# Patient Record
Sex: Female | Born: 1935 | Race: White | Hispanic: No | Smoking: Never smoker
Health system: Southern US, Community
[De-identification: ages and names within clinical notes are randomized; demographics above are authoritative.]

## PROBLEM LIST (undated history)

## (undated) DIAGNOSIS — I10 Essential (primary) hypertension: Secondary | ICD-10-CM

## (undated) DIAGNOSIS — E7800 Pure hypercholesterolemia, unspecified: Secondary | ICD-10-CM

---

## 2018-07-22 ENCOUNTER — Emergency Department: Payer: Medicare Other

## 2018-07-22 ENCOUNTER — Other Ambulatory Visit: Payer: Self-pay

## 2018-07-22 ENCOUNTER — Emergency Department
Admission: EM | Admit: 2018-07-22 | Discharge: 2018-07-22 | Disposition: A | Discharge status: Home / Self Care | Payer: Medicare Other | Attending: Emergency Medicine | Admitting: Emergency Medicine

## 2018-07-22 ENCOUNTER — Encounter: Payer: Self-pay | Admitting: Emergency Medicine

## 2018-07-22 DIAGNOSIS — W010XXA Fall on same level from slipping, tripping and stumbling without subsequent striking against object, initial encounter: Secondary | ICD-10-CM | POA: Diagnosis not present

## 2018-07-22 DIAGNOSIS — I10 Essential (primary) hypertension: Secondary | ICD-10-CM | POA: Diagnosis not present

## 2018-07-22 DIAGNOSIS — M25551 Pain in right hip: Secondary | ICD-10-CM

## 2018-07-22 DIAGNOSIS — S79911A Unspecified injury of right hip, initial encounter: Secondary | ICD-10-CM | POA: Diagnosis present

## 2018-07-22 DIAGNOSIS — Y9389 Activity, other specified: Secondary | ICD-10-CM | POA: Diagnosis not present

## 2018-07-22 DIAGNOSIS — Y999 Unspecified external cause status: Secondary | ICD-10-CM | POA: Diagnosis not present

## 2018-07-22 DIAGNOSIS — Y92008 Other place in unspecified non-institutional (private) residence as the place of occurrence of the external cause: Secondary | ICD-10-CM | POA: Insufficient documentation

## 2018-07-22 DIAGNOSIS — S7001XA Contusion of right hip, initial encounter: Secondary | ICD-10-CM

## 2018-07-22 DIAGNOSIS — W19XXXA Unspecified fall, initial encounter: Secondary | ICD-10-CM

## 2018-07-22 HISTORY — DX: Essential (primary) hypertension: I10

## 2018-07-22 HISTORY — DX: Pure hypercholesterolemia, unspecified: E78.00

## 2018-07-22 NOTE — ED Notes (Signed)
Pt demonstrated walker use and verbalized feeling safe being discharged. NAD at this time and pt able to ambulate. Pt taken to the lobby via a wheelchai and will be taken home by son.

## 2018-07-22 NOTE — ED Notes (Signed)
Pt reporting pain when moving right leg and pain when bearing weight. Pain is worse in the posterior thigh. No deformity, discoloration or swelling noted upon assessment. Pt able to bear weight and pt able to move leg.

## 2018-07-22 NOTE — ED Triage Notes (Signed)
Pt presents to ED via POV with c/o of right hip pain. Pt son states pt fell yesterday evening while walking outside. Pt states she tripped on rocks and was able to stand back up on her own. Pt denies any LOC or dizziness at time of fall. Pt woke up this morning and was unable to bear any weight on right leg. Pt in NAD at this time.

## 2018-07-22 NOTE — Discharge Instructions (Addendum)
Your xray today did not show any fracture. Please follow up with your doctor for continued monitoring of your symptoms.

## 2018-07-22 NOTE — ED Provider Notes (Signed)
[facility] Emergency Department Provider Note  ____________________________________________  Time seen: Approximately 6:55 PM  I have reviewed the triage vital signs and the nursing notes.   HISTORY  Chief Complaint Hip Pain and Fall    HPI Barbara Lara is a 84 y.o. female with a history of hypertension and hyperlipidemia is brought to the ED today due to right hip pain.  Patient was in her usual state of health when she had a trip and fall yesterday while feeding cats in the garage.  No head injury.  She sustained a small laceration to the left shin, was able to get up and walk immediately afterward.  She did had some gradual onset of right hip pain with walking yesterday evening, worse this morning.  She is able to bear weight still but notes that it hurts whenever she moves.  Nonradiating, moderate intensity, aching.  No paresthesias or weakness.  Denies head injury vision changes or neck pain.  Went to primary care today who sent her to the ED to evaluate for possible hip fracture.      Past Medical History:  Diagnosis Date  . Hypercholesteremia   . Hypertension      There are no active problems to display for this patient.    History reviewed. No pertinent surgical history.   Prior to Admission medications   Not on File  Atenolol Statin Amlodipine   Allergies Avelox [moxifloxacin hcl in nacl]; Lisinopril; and Penicillins   No family history on file.  Social History Social History   Tobacco Use  . Smoking status: Never Smoker  . Smokeless tobacco: Never Used  Substance Use Topics  . Alcohol use: Never    Frequency: Never  . Drug use: Never    Review of Systems  Constitutional:   No fever or chills.  Cardiovascular:   No chest pain or syncope. Respiratory:   No dyspnea or cough. Gastrointestinal:   Negative for abdominal pain, vomiting and diarrhea.  Musculoskeletal: Right hip pain as above. All other systems reviewed  and are negative except as documented above in ROS and HPI.  ____________________________________________   PHYSICAL EXAM:  VITAL SIGNS: ED Triage Vitals  Enc Vitals Group     BP 07/22/18 1313 (!) 122/59     Pulse Rate 07/22/18 1313 63     Resp 07/22/18 1313 20     Temp 07/22/18 1313 98.3 F (36.8 C)     Temp Source 07/22/18 1313 Oral     SpO2 07/22/18 1313 96 %     Weight 07/22/18 1321 98 lb (44.5 kg)     Height 07/22/18 1321 5\' 5"  (1.651 m)     Head Circumference --      Peak Flow --      Pain Score 07/22/18 1314 7     Pain Loc --      Pain Edu? --      Excl. in GC? --     Vital signs reviewed, nursing assessments reviewed.   Constitutional:   Alert and oriented. Non-toxic appearance. Eyes:   Conjunctivae are normal. EOMI. PERRL. ENT      Head:   Normocephalic and atraumatic.      Nose:   No congestion/rhinnorhea.       Mouth/Throat:   MMM, no pharyngeal erythema. No peritonsillar mass.       Neck:   No meningismus. Full ROM.  Midline spinal tenderness. Hematological/Lymphatic/Immunilogical:   No cervical lymphadenopathy. Cardiovascular:   RRR. Symmetric bilateral radial  and DP pulses.  No murmurs. Cap refill less than 2 seconds. Respiratory:   Normal respiratory effort without tachypnea/retractions. Breath sounds are clear and equal bilaterally. No wheezes/rales/rhonchi. Gastrointestinal:   Soft and nontender. Non distended. There is no CVA tenderness.  No rebound, rigidity, or guarding. Musculoskeletal:   Normal range of motion in all extremities. No joint effusions.  No focal lower extremity tenderness.  No edema.  Pain with movement of the leg on the medial and lateral right hip.  Able to stand, no pain with static standing, but pain when she starts walking. Neurologic:   Normal speech and language.  Motor grossly intact. No acute focal neurologic deficits are appreciated.  Skin:    Skin is warm, dry with a small 1 cm laceration on the left shin, not gaping, not  inflamed, hemostatic.  ____________________________________________    LABS (pertinent positives/negatives) (all labs ordered are listed, but only abnormal results are displayed) Labs Reviewed - No data to display ____________________________________________   EKG    ____________________________________________    RADIOLOGY  Dg Hip Unilat  With Pelvis 2-3 Views Right  Result Date: 07/22/2018 CLINICAL DATA:  84 year old female status post fall in driveway yesterday with right hip pain. EXAM: DG HIP (WITH OR WITHOUT PELVIS) 2-3V RIGHT COMPARISON:  None. FINDINGS: The femoral heads are normally located. Hip joint spaces are within normal limits for age, mildly decreased on the right. Osteopenia. No pelvis fracture identified. Sacrum detail is limited due to overlying bowel. Negative bowel gas pattern. Grossly intact proximal left femur. The proximal right femur appears intact. IMPRESSION: No acute fracture or dislocation identified about the right hip or pelvis. If occult hip fracture is suspected or if the patient is unable to weightbear, MRI is the preferred modality for further evaluation. Electronically Signed   By: Odessa FlemingH  Hall M.D.   On: 07/22/2018 14:31    ____________________________________________   PROCEDURES Procedures  ____________________________________________    CLINICAL IMPRESSION / ASSESSMENT AND PLAN / ED COURSE  Pertinent labs & imaging results that were available during my care of the patient were reviewed by me and considered in my medical decision making (see chart for details).    Patient presents with right hip pain after a fall yesterday.  Able to bear weight, x-ray unremarkable.  Does show osteopenia but with her ability to bear weight without severe pain, and only pain with walking I doubt an occult hip fracture.  Would not do an MRI right now, outpatient follow-up.  If symptoms do not improve or worsen then at that point I will pursue further advanced  imaging.  Does not need a tetanus shot today.  Small laceration on the left shin is not repairable as the injury occurred 24 hours ago.  No other apparent traumatic injuries, suitable for discharge home.  Provided a walker for stability and symptom relief.  Advised NSAIDs.. Clinical Course as of Jul 22 1854  Wed Jul 22, 2018  1738 Tetanus up to date from one year ago.    [PS]    Clinical Course User Index [PS] Sharman CheekStafford, Ramina Hulet, MD     ____________________________________________   FINAL CLINICAL IMPRESSION(S) / ED DIAGNOSES    Final diagnoses:  Fall, initial encounter  Right hip pain  Contusion of right hip, initial encounter     ED Discharge Orders    None      Portions of this note were generated with dragon dictation software. Dictation errors may occur despite best attempts at proofreading.  Sharman CheekStafford, Tymara Saur, MD 07/22/18 [phone]

## 2018-07-22 NOTE — ED Notes (Signed)
Pt able to stand and ambulate to the bedside toilet without difficulty. Pt verbalized increased pain but was able to ambulate independently.

## 2019-01-28 ENCOUNTER — Encounter: Payer: Self-pay | Admitting: *Deleted

## 2019-01-28 ENCOUNTER — Emergency Department: Payer: Medicare Other

## 2019-01-28 ENCOUNTER — Inpatient Hospital Stay
Admission: EM | Admit: 2019-01-28 | Discharge: 2019-02-03 | DRG: 640 | Disposition: A | Discharge status: Home Health | Payer: Medicare Other | Attending: Internal Medicine | Admitting: Internal Medicine

## 2019-01-28 ENCOUNTER — Other Ambulatory Visit: Payer: Self-pay

## 2019-01-28 DIAGNOSIS — R059 Cough, unspecified: Secondary | ICD-10-CM

## 2019-01-28 DIAGNOSIS — E869 Volume depletion, unspecified: Secondary | ICD-10-CM | POA: Diagnosis present

## 2019-01-28 DIAGNOSIS — E785 Hyperlipidemia, unspecified: Secondary | ICD-10-CM | POA: Diagnosis present

## 2019-01-28 DIAGNOSIS — E7800 Pure hypercholesterolemia, unspecified: Secondary | ICD-10-CM | POA: Diagnosis present

## 2019-01-28 DIAGNOSIS — Z79899 Other long term (current) drug therapy: Secondary | ICD-10-CM | POA: Diagnosis not present

## 2019-01-28 DIAGNOSIS — E43 Unspecified severe protein-calorie malnutrition: Secondary | ICD-10-CM

## 2019-01-28 DIAGNOSIS — Z888 Allergy status to other drugs, medicaments and biological substances status: Secondary | ICD-10-CM | POA: Diagnosis not present

## 2019-01-28 DIAGNOSIS — R05 Cough: Secondary | ICD-10-CM

## 2019-01-28 DIAGNOSIS — T502X5A Adverse effect of carbonic-anhydrase inhibitors, benzothiadiazides and other diuretics, initial encounter: Secondary | ICD-10-CM | POA: Diagnosis present

## 2019-01-28 DIAGNOSIS — J209 Acute bronchitis, unspecified: Secondary | ICD-10-CM | POA: Diagnosis present

## 2019-01-28 DIAGNOSIS — Z7982 Long term (current) use of aspirin: Secondary | ICD-10-CM | POA: Diagnosis not present

## 2019-01-28 DIAGNOSIS — E876 Hypokalemia: Secondary | ICD-10-CM | POA: Diagnosis present

## 2019-01-28 DIAGNOSIS — I10 Essential (primary) hypertension: Secondary | ICD-10-CM | POA: Diagnosis present

## 2019-01-28 DIAGNOSIS — E871 Hypo-osmolality and hyponatremia: Secondary | ICD-10-CM | POA: Diagnosis present

## 2019-01-28 DIAGNOSIS — Z66 Do not resuscitate: Secondary | ICD-10-CM | POA: Diagnosis present

## 2019-01-28 DIAGNOSIS — Z880 Allergy status to penicillin: Secondary | ICD-10-CM | POA: Diagnosis not present

## 2019-01-28 LAB — URINALYSIS, COMPLETE (UACMP) WITH MICROSCOPIC
Bacteria, UA: NONE SEEN
Bilirubin Urine: NEGATIVE
Glucose, UA: NEGATIVE mg/dL
Hgb urine dipstick: NEGATIVE
KETONES UR: 5 mg/dL — AB
Leukocytes,Ua: NEGATIVE
Nitrite: NEGATIVE
PROTEIN: NEGATIVE mg/dL
Specific Gravity, Urine: 1.015 (ref 1.005–1.030)
pH: 6 (ref 5.0–8.0)

## 2019-01-28 LAB — BASIC METABOLIC PANEL
Anion gap: 15 (ref 5–15)
BUN: 20 mg/dL (ref 8–23)
CHLORIDE: 72 mmol/L — AB (ref 98–111)
CO2: 27 mmol/L (ref 22–32)
Calcium: 9.1 mg/dL (ref 8.9–10.3)
Creatinine, Ser: 0.86 mg/dL (ref 0.44–1.00)
GFR calc Af Amer: >60 mL/min (ref >60)
GFR calc non Af Amer: 59 mL/min — ABNORMAL LOW (ref >60)
GLUCOSE: 119 mg/dL — AB (ref 70–99)
Potassium: 3.1 mmol/L — ABNORMAL LOW (ref 3.5–5.1)
SODIUM: 114 mmol/L — AB (ref 135–145)

## 2019-01-28 LAB — SODIUM, URINE, RANDOM: Sodium, Ur: <10 mmol/L

## 2019-01-28 LAB — CBC
HCT: 42.5 % (ref 36.0–46.0)
HEMATOCRIT: 40.5 % (ref 36.0–46.0)
HEMOGLOBIN: 15.7 g/dL — AB (ref 12.0–15.0)
Hemoglobin: 14.7 g/dL (ref 12.0–15.0)
MCH: 30.2 pg (ref 26.0–34.0)
MCH: 29.9 pg (ref 26.0–34.0)
MCHC: 36.9 g/dL — ABNORMAL HIGH (ref 30.0–36.0)
MCHC: 36.3 g/dL — ABNORMAL HIGH (ref 30.0–36.0)
MCV: 81.7 fL (ref 80.0–100.0)
MCV: 82.5 fL (ref 80.0–100.0)
PLATELETS: 308 10*3/uL (ref 150–400)
Platelets: 307 10*3/uL (ref 150–400)
RBC: 5.2 MIL/uL — AB (ref 3.87–5.11)
RBC: 4.91 MIL/uL (ref 3.87–5.11)
RDW: 11.8 % (ref 11.5–15.5)
RDW: 11.8 % (ref 11.5–15.5)
WBC: 7.3 10*3/uL (ref 4.0–10.5)
WBC: 7.7 10*3/uL (ref 4.0–10.5)
nRBC: 0 % (ref 0.0–0.2)
nRBC: 0 % (ref 0.0–0.2)

## 2019-01-28 LAB — CREATININE, SERUM
Creatinine, Ser: 0.88 mg/dL (ref 0.44–1.00)
GFR calc Af Amer: >60 mL/min (ref >60)
GFR calc non Af Amer: 57 mL/min — ABNORMAL LOW (ref >60)

## 2019-01-28 LAB — OSMOLALITY, URINE: Osmolality, Ur: 501 mOsm/kg (ref 300–900)

## 2019-01-28 LAB — SODIUM: Sodium: 114 mmol/L — CL (ref 135–145)

## 2019-01-28 MED ORDER — SODIUM CHLORIDE 0.9 % IV SOLN
INTRAVENOUS | Status: DC
  Administered 2019-01-28 – 2019-01-30 (×3): via INTRAVENOUS

## 2019-01-28 MED ORDER — ACETAMINOPHEN 650 MG RE SUPP: 650.0000 mg | Freq: Four times a day (QID) | RECTAL | Status: DC | PRN

## 2019-01-28 MED ORDER — SENNOSIDES-DOCUSATE SODIUM 8.6-50 MG PO TABS
1.0000 | ORAL_TABLET | Freq: Every evening | ORAL | Status: DC | PRN
Start: 2019-01-28 — End: 2019-02-03

## 2019-01-28 MED ORDER — POTASSIUM CHLORIDE CRYS ER 20 MEQ PO TBCR
40.0000 meq | EXTENDED_RELEASE_TABLET | Freq: Once | ORAL | Status: AC
  Administered 2019-01-28: 40 meq via ORAL
  Filled 2019-01-28: qty 2

## 2019-01-28 MED ORDER — ACETAMINOPHEN 325 MG PO TABS
650.0000 mg | ORAL_TABLET | Freq: Four times a day (QID) | ORAL | Status: DC | PRN
  Administered 2019-02-03: 650 mg via ORAL
  Filled 2019-01-28: qty 2

## 2019-01-28 MED ORDER — ONDANSETRON HCL 4 MG/2ML IJ SOLN: 4.0000 mg | Freq: Four times a day (QID) | INTRAMUSCULAR | Status: DC | PRN

## 2019-01-28 MED ORDER — SIMVASTATIN 10 MG PO TABS
10.0000 mg | ORAL_TABLET | Freq: Every day | ORAL | Status: DC
  Administered 2019-01-28 – 2019-02-02 (×6): 10 mg via ORAL
  Filled 2019-01-28 (×7): qty 1

## 2019-01-28 MED ORDER — ONDANSETRON HCL 4 MG PO TABS: 4.0000 mg | ORAL_TABLET | Freq: Four times a day (QID) | ORAL | Status: DC | PRN

## 2019-01-28 MED ORDER — ATENOLOL 50 MG PO TABS
75.0000 mg | ORAL_TABLET | Freq: Two times a day (BID) | ORAL | Status: DC
  Administered 2019-01-28 – 2019-02-03 (×12): 75 mg via ORAL
  Filled 2019-01-28: qty 3
  Filled 2019-01-28 (×8): qty 1
  Filled 2019-01-28: qty 3
  Filled 2019-01-28 (×3): qty 1

## 2019-01-28 MED ORDER — DOXYCYCLINE HYCLATE 100 MG PO TABS
100.0000 mg | ORAL_TABLET | Freq: Two times a day (BID) | ORAL | Status: DC
  Administered 2019-01-28 – 2019-02-01 (×9): 100 mg via ORAL
  Filled 2019-01-28 (×9): qty 1

## 2019-01-28 MED ORDER — ENOXAPARIN SODIUM 40 MG/0.4ML ~~LOC~~ SOLN
40.0000 mg | SUBCUTANEOUS | Status: DC
  Administered 2019-01-29: 40 mg via SUBCUTANEOUS
  Filled 2019-01-28 (×2): qty 0.4

## 2019-01-28 MED ORDER — ASPIRIN EC 81 MG PO TBEC
81.0000 mg | DELAYED_RELEASE_TABLET | Freq: Every day | ORAL | Status: DC
  Administered 2019-01-29 – 2019-02-03 (×6): 81 mg via ORAL
  Filled 2019-01-28 (×6): qty 1

## 2019-01-28 NOTE — ED Notes (Signed)
Patient ambulated to restroom and help back to bed.

## 2019-01-28 NOTE — ED Triage Notes (Signed)
PT to ED reporting congested cough with yellow sputum and  Congestion. Pt reporting, "I just don't feel right in the head" Son reporting pt had an episode of SOB and potential syncopal episodes this morning while trying to wake up. No fevers, vomiting or diarrhea. Pt has had decreased appetite.

## 2019-01-28 NOTE — H&P (Signed)
[facility] [place] [facility]   PATIENT NAME: Barbara Lara    MR#:  [id number]  DATE OF BIRTH:  05-09-1925  DATE OF ADMISSION:  01/28/2019  PRIMARY CARE PHYSICIAN: Dan Humphreys, [facility]   REQUESTING/REFERRING PHYSICIAN: Dr. Phineas Real  CHIEF COMPLAINT:   Weakness HISTORY OF PRESENT ILLNESS:  Barbara Lara  is a 85 y.o. female with a known history of essential hypertension who presents to the emergency room with her son due to generalized weakness and productive cough.  Over the past few days patient has had a decreased p.o. appetite and has been complaining of a productive cough with yellowish sputum.  She has had no fevers.  She reports that she just does not feel right.  While in the emergency room laboratory work reveals a sodium level of 114. she is on hydrochlorothiazide for blood pressure.  She denies chest pain or shortness of breath.  PAST MEDICAL HISTORY:   Past Medical History:  Diagnosis Date  . Hypercholesteremia   . Hypertension     PAST SURGICAL HISTORY:  History reviewed. No pertinent surgical history.  SOCIAL HISTORY:   Social History   Tobacco Use  . Smoking status: Never Smoker  . Smokeless tobacco: Never Used  Substance Use Topics  . Alcohol use: Never    Frequency: Never    FAMILY HISTORY:  History reviewed. No pertinent family history.  DRUG ALLERGIES:   Allergies  Allergen Reactions  . Avelox [Moxifloxacin Hcl In Nacl]     Hallucinations  . Lisinopril     Facial edema   . Penicillins     unknown    REVIEW OF SYSTEMS:   Review of Systems  Constitutional: Positive for malaise/fatigue. Negative for chills and fever.  HENT: Negative.  Negative for ear discharge, ear pain, hearing loss, nosebleeds and sore throat.   Eyes: Negative.  Negative for blurred vision and pain.  Respiratory: Positive for cough. Negative for hemoptysis, shortness of breath and wheezing.   Cardiovascular: Negative.  Negative for chest  pain, palpitations and leg swelling.  Gastrointestinal: Negative.  Negative for abdominal pain, blood in stool, diarrhea, nausea and vomiting.  Genitourinary: Negative.  Negative for dysuria.  Musculoskeletal: Negative.  Negative for back pain.  Skin: Negative.   Neurological: Negative for dizziness, tremors, speech change, focal weakness, seizures and headaches.  Endo/Heme/Allergies: Negative.  Does not bruise/bleed easily.  Psychiatric/Behavioral: Negative.  Negative for depression, hallucinations and suicidal ideas.    MEDICATIONS AT HOME:   Prior to Admission medications   Medication Sig Start Date End Date Taking? Authorizing Provider  aspirin (ASPIRIN ADULT LOW DOSE) 81 MG EC tablet Take 81 mg by mouth daily. 06/02/08  Yes [provider]  atenolol (TENORMIN) 50 MG tablet Take 75 mg by mouth 2 (two) times daily. 02/13/18  Yes [provider]  cloNIDine (CATAPRES) 0.1 MG tablet Take 0.1 mg by mouth daily as needed. (take only if systolic blood pressure is greater than 180) 07/17/18  Yes [provider]  hydrochlorothiazide (HYDRODIURIL) 25 MG tablet Take 25 mg by mouth daily. 12/10/18  Yes [provider]  simvastatin (ZOCOR) 10 MG tablet Take 10 mg by mouth Nightly. 03/16/18  Yes [provider]      VITAL SIGNS:  Blood pressure 131/64, pulse 62, temperature 97.7 F (36.5 C), temperature source Oral, resp. rate 16, weight 44.5 kg, SpO2 93 %.  PHYSICAL EXAMINATION:   Physical Exam Constitutional:      General: She is not in  acute distress. HENT:     Head: Normocephalic.  Eyes:     General: No scleral icterus. Neck:     Musculoskeletal: Normal range of motion and neck supple.     Vascular: No JVD.     Trachea: No tracheal deviation.  Cardiovascular:     Rate and Rhythm: Normal rate and regular rhythm.     Heart sounds: Normal heart sounds. No murmur. No friction rub. No gallop.   Pulmonary:     Effort: Pulmonary effort is normal. No  respiratory distress.     Breath sounds: Normal breath sounds. No wheezing or rales.  Chest:     Chest wall: No tenderness.  Abdominal:     General: Bowel sounds are normal. There is no distension.     Palpations: Abdomen is soft. There is no mass.     Tenderness: There is no abdominal tenderness. There is no guarding or rebound.  Musculoskeletal: Normal range of motion.  Skin:    General: Skin is warm.     Findings: No erythema or rash.  Neurological:     Mental Status: She is alert and oriented to person, place, and time.  Psychiatric:        Judgment: Judgment normal.       LABORATORY PANEL:   CBC Recent Labs  Lab 01/28/19 1213  WBC 7.3  HGB 15.7*  HCT 42.5  PLT 308   ------------------------------------------------------------------------------------------------------------------  Chemistries  Recent Labs  Lab 01/28/19 1213  NA 114*  K 3.1*  CL 72*  CO2 27  GLUCOSE 119*  BUN 20  CREATININE 0.86  CALCIUM 9.1   ------------------------------------------------------------------------------------------------------------------  Cardiac Enzymes No results for input(s): TROPONINI in the last 168 hours. ------------------------------------------------------------------------------------------------------------------  RADIOLOGY:  Dg Chest 2 View  Result Date: 01/28/2019 CLINICAL DATA:  Cough and congestion. EXAM: CHEST - 2 VIEW COMPARISON:  None. FINDINGS: The heart is mildly enlarged. There is moderate tortuosity and calcification of the thoracic aorta. The lungs are clear of an acute process. No worrisome pulmonary lesions. No pleural effusion. The bony thorax is intact. Remote healed rib fractures are noted and there is also a remote appearing compression fracture of T8. IMPRESSION: No acute cardiopulmonary findings. Electronically Signed   By: Rudie Meyer M.D.   On: 01/28/2019 12:36    EKG:  Normal sinus rhythm no ST elevation or depression  IMPRESSION  AND PLAN:   85 year old female who presents from home due to generalized weakness and cough and found to have a sodium level of 114.  1.  Symptomatic hyponatremia: I have spoken with Dr. Thedore Mins who will see the patient in consultation and write fluid orders as indicated. Sodium level every 6 hours Check TSH Stop hydrochlorothiazide Consult placed via epic to intensivist as well as patient will go to stepdown/ICU.   2.  Essential hypertension: Continue atenolol and stop hydrochlorothiazide 3.  Hypokalemia from poor p.o. intake: Replace and recheck in a.m.         All the records are reviewed and case discussed with ED provider. Management plans discussed with the patient and son and they are in agreement  CODE STATUS: DNR  Critical care TOTAL TIME TAKING CARE OF THIS PATIENT: 55 minutes.    Barbara Lara M.D on 01/28/2019 at 2:28 PM  Between 7am to 6pm - Pager - [phone]  After 6pm go to [email or web address] - Social research officer, government  [facility]  Office  [phone]  CC: Primary care physician; [facility]

## 2019-01-28 NOTE — ED Provider Notes (Signed)
[facility] Emergency Department Provider Note   ____________________________________________    I have reviewed the triage vital signs and the nursing notes.   HISTORY  Chief Complaint Cough and Shortness of Breath     HPI Barbara Lara is a 85 y.o. female who presents with complaints of cough, brief episode of shortness of breath and confusion.  Son reports that usually in the morning the patient is up and active but today she was still in bed and seemed confused.  She has had a little bit of a cough recently.  No reports of fevers.  No vomiting.  Patient reports she feels overall well  Past Medical History:  Diagnosis Date  . Hypercholesteremia   . Hypertension     Patient Active Problem List   Diagnosis Date Noted  . Hyponatremia 01/28/2019    History reviewed. No pertinent surgical history.  Prior to Admission medications   Medication Sig Start Date End Date Taking? Authorizing Provider  aspirin (ASPIRIN ADULT LOW DOSE) 81 MG EC tablet Take 81 mg by mouth daily. 06/02/08  Yes [provider]  atenolol (TENORMIN) 50 MG tablet Take 75 mg by mouth 2 (two) times daily. 02/13/18  Yes [provider]  cloNIDine (CATAPRES) 0.1 MG tablet Take 0.1 mg by mouth daily as needed. (take only if systolic blood pressure is greater than 180) 07/17/18  Yes [provider]  hydrochlorothiazide (HYDRODIURIL) 25 MG tablet Take 25 mg by mouth daily. 12/10/18  Yes [provider]  simvastatin (ZOCOR) 10 MG tablet Take 10 mg by mouth Nightly. 03/16/18  Yes [provider]     Allergies Avelox [moxifloxacin hcl in nacl]; Lisinopril; and Penicillins  History reviewed. No pertinent family history.  Social History Social History   Tobacco Use  . Smoking status: Never Smoker  . Smokeless tobacco: Never Used  Substance Use Topics  . Alcohol use: Never    Frequency: Never  . Drug use: Never    Review of  Systems  Constitutional: No reports of fevers Eyes: No visual changes.  ENT: No neck pain Cardiovascular: Denies chest pain. Respiratory: Cough Gastrointestinal: No abdominal pain.    Genitourinary: Negative for dysuria. Musculoskeletal: No joint swelling Skin: Negative for rash. Neurological: Negative for headaches    ____________________________________________   PHYSICAL EXAM:  VITAL SIGNS: ED Triage Vitals  Enc Vitals Group     BP 01/28/19 1208 131/64     Pulse Rate 01/28/19 1208 62     Resp 01/28/19 1208 16     Temp 01/28/19 1208 97.7 F (36.5 C)     Temp Source 01/28/19 1208 Oral     SpO2 01/28/19 1208 93 %     Weight 01/28/19 1209 44.5 kg (98 lb 1.7 oz)     Height --      Head Circumference --      Peak Flow --      Pain Score 01/28/19 1209 0     Pain Loc --      Pain Edu? --      Excl. in GC? --     Constitutional: Alert Eyes: Conjunctivae are normal.   Nose: No congestion/rhinnorhea. Mouth/Throat: Mucous membranes are moist.    Cardiovascular: Normal rate, regular rhythm. Grossly normal heart sounds.  Good peripheral circulation. Respiratory: Normal respiratory effort.  No retractions. Lungs CTAB. Gastrointestinal: Soft and nontender. No distention.   Musculoskeletal:  Warm and well perfused Neurologic:  Normal speech and language. No gross focal neurologic  deficits are appreciated.  Skin:  Skin is warm, dry and intact. No rash noted. Psychiatric: Mood and affect are normal. Speech and behavior are normal.  ____________________________________________   LABS (all labs ordered are listed, but only abnormal results are displayed)  Labs Reviewed  BASIC METABOLIC PANEL - Abnormal; Notable for the following components:      Result Value   Sodium 114 (*)    Potassium 3.1 (*)    Chloride 72 (*)    Glucose, Bld 119 (*)    GFR calc non Af Amer 59 (*)    All other components within normal limits  CBC - Abnormal; Notable for the following components:    RBC 5.20 (*)    Hemoglobin 15.7 (*)    MCHC 36.9 (*)    All other components within normal limits  URINALYSIS, COMPLETE (UACMP) WITH MICROSCOPIC  CBC  CREATININE, SERUM  SODIUM  SODIUM  SODIUM   ____________________________________________  EKG  ED ECG REPORT I, Jene Every, the attending physician, personally viewed and interpreted this ECG.  Date: 01/28/2019  Rhythm: normal sinus rhythm QRS Axis: normal Intervals: Prolonged QT ST/T Wave abnormalities: normal Narrative Interpretation: no evidence of acute ischemia  ____________________________________________  RADIOLOGY  X-ray unremarkable ____________________________________________   PROCEDURES  Procedure(s) performed: No  Procedures   Critical Care performed:yes  CRITICAL CARE Performed by: Jene Every   Total critical care time: 30 minutes  Critical care time was exclusive of separately billable procedures and treating other patients.  Critical care was necessary to treat or prevent imminent or life-threatening deterioration.  Critical care was time spent personally by me on the following activities: development of treatment plan with patient and/or surrogate as well as nursing, discussions with consultants, evaluation of patient's response to treatment, examination of patient, obtaining history from patient or surrogate, ordering and performing treatments and interventions, ordering and review of laboratory studies, ordering and review of radiographic studies, pulse oximetry and re-evaluation of patient's condition.  ____________________________________________   INITIAL IMPRESSION / ASSESSMENT AND PLAN / ED COURSE  Pertinent labs & imaging results that were available during my care of the patient were reviewed by me and considered in my medical decision making (see chart for details).  Patient presents with episode of altered mental status/confusion, mild cough.  Exam is overall reassuring,  chest x-ray unremarkable.  Lab work significant for severe hyponatremia of 114.  Review of care everywhere demonstrates last sodium level of 129.  Discussed with nephrology and Dr. Tildon Husky of hospitalist service to admit the patient    ____________________________________________   FINAL CLINICAL IMPRESSION(S) / ED DIAGNOSES  Final diagnoses:  Hyponatremia        Note:  This document was prepared using Dragon voice recognition software and may include unintentional dictation errors.   Jene Every, MD 01/28/19 1430

## 2019-01-28 NOTE — ED Notes (Signed)
Pt up to the bathroom to attempt to give a urine sample, was unable to provide sample at this time.
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
## 2019-01-28 NOTE — ED Notes (Signed)
Report received from [place], Charity fundraiser. Pt care assumed at this time.

## 2019-01-28 NOTE — ED Notes (Addendum)
Notified by [facility] that they are full and pt would not be coming to [facility]. Stated the dr Corlis Leak would call dr mody. Charge nurse notified

## 2019-01-29 ENCOUNTER — Inpatient Hospital Stay: Payer: Medicare Other

## 2019-01-29 LAB — BASIC METABOLIC PANEL
Anion gap: 8 (ref 5–15)
BUN: 19 mg/dL (ref 8–23)
CHLORIDE: 83 mmol/L — AB (ref 98–111)
CO2: 28 mmol/L (ref 22–32)
Calcium: 7.9 mg/dL — ABNORMAL LOW (ref 8.9–10.3)
Creatinine, Ser: 0.65 mg/dL (ref 0.44–1.00)
GFR calc Af Amer: >60 mL/min (ref >60)
GFR calc non Af Amer: >60 mL/min (ref >60)
Glucose, Bld: 101 mg/dL — ABNORMAL HIGH (ref 70–99)
Potassium: 3 mmol/L — ABNORMAL LOW (ref 3.5–5.1)
Sodium: 119 mmol/L — CL (ref 135–145)

## 2019-01-29 LAB — SODIUM
SODIUM: 119 mmol/L — AB (ref 135–145)
Sodium: 117 mmol/L — CL (ref 135–145)
Sodium: 123 mmol/L — ABNORMAL LOW (ref 135–145)

## 2019-01-29 LAB — MAGNESIUM: Magnesium: 1.9 mg/dL (ref 1.7–2.4)

## 2019-01-29 LAB — TSH: TSH: 1.498 u[IU]/mL (ref 0.350–4.500)

## 2019-01-29 MED ORDER — LABETALOL HCL 5 MG/ML IV SOLN
10.0000 mg | INTRAVENOUS | Status: DC | PRN
  Administered 2019-01-29: 10 mg via INTRAVENOUS
  Filled 2019-01-29: qty 4

## 2019-01-29 MED ORDER — POTASSIUM CHLORIDE CRYS ER 20 MEQ PO TBCR
20.0000 meq | EXTENDED_RELEASE_TABLET | Freq: Two times a day (BID) | ORAL | Status: DC
  Administered 2019-01-29 – 2019-01-31 (×6): 20 meq via ORAL
  Filled 2019-01-29 (×6): qty 1

## 2019-01-29 MED ORDER — HYDRALAZINE HCL 20 MG/ML IJ SOLN
10.0000 mg | INTRAMUSCULAR | Status: DC | PRN
  Administered 2019-01-30 – 2019-02-02 (×3): 10 mg via INTRAVENOUS
  Filled 2019-01-29 (×3): qty 1

## 2019-01-29 NOTE — ED Notes (Signed)
ED TO INPATIENT HANDOFF REPORT  ED Nurse Name and Phone #: Charlot Gouin W.,RN   S Name/Age/Gender Barbara Lara 85 y.o. female Room/Bed: ED07A/ED07A  Code Status   Code Status: DNR  Home/SNF/Other Home Patient oriented to: self, place, time and situation; with episodes of confusion  Is this baseline? Yes   Triage Complete: Triage complete  Chief Complaint cough, dizziness, sob  Triage Note PT to ED reporting congested cough with yellow sputum and  Congestion. Pt reporting, "I just don't feel right in the head" Son reporting pt had an episode of SOB and potential syncopal episodes this morning while trying to wake up. No fevers, vomiting or diarrhea. Pt has had decreased appetite.    Allergies Allergies  Allergen Reactions  . Avelox [Moxifloxacin Hcl In Nacl]     Hallucinations  . Lisinopril     Facial edema   . Penicillins     unknown    Level of Care/Admitting Diagnosis ED Disposition    ED Disposition Condition Comment   Admit  Hospital Area: [facility] [[id number]]  Level of Care: Med-Surg [16]  Diagnosis: Hyponatremia [702637]  Admitting Physician: Adrian Saran [[id number]]  Attending Physician: MODY, Patricia Pesa [[id number]]  Estimated length of stay: past midnight tomorrow  Certification:: I certify this patient will need inpatient services for at least 2 midnights  PT Class (Do Not Modify): Inpatient [101]  PT Acc Code (Do Not Modify): Private [1]       B Medical/Surgery History Past Medical History:  Diagnosis Date  . Hypercholesteremia   . Hypertension    History reviewed. No pertinent surgical history.   A IV Location/Drains/Wounds Patient Lines/Drains/Airways Status   Active Line/Drains/Airways    Name:   Placement date:   Placement time:   Site:   Days:   Peripheral IV 01/29/19 Left Forearm   01/29/19    0630    Forearm   less than 1          Intake/Output Last 24 hours  Intake/Output Summary (Last 24 hours) at 01/29/2019 8786 Last  data filed at 01/28/2019 2316 Gross per 24 hour  Intake 614.82 ml  Output -  Net 614.82 ml    Labs/Imaging Results for orders placed or performed during the hospital encounter of 01/28/19 (from the past 48 hour(s))  Basic metabolic panel     Status: Abnormal   Collection Time: 01/28/19 12:13 PM  Result Value Ref Range   Sodium 114 (LL) 135 - 145 mmol/L    Comment: CRITICAL RESULT CALLED TO, READ BACK BY AND VERIFIED WITH  ALICIA GRAINGER AT 1312 01/28/19 SDR    Potassium 3.1 (L) 3.5 - 5.1 mmol/L   Chloride 72 (L) 98 - 111 mmol/L   CO2 27 22 - 32 mmol/L   Glucose, Bld 119 (H) 70 - 99 mg/dL   BUN 20 8 - 23 mg/dL   Creatinine, Ser 7.67 0.44 - 1.00 mg/dL   Calcium 9.1 8.9 - 20.9 mg/dL   GFR calc non Af Amer 59 (L) >60 mL/min   GFR calc Af Amer >60 >60 mL/min   Anion gap 15 5 - 15    Comment: Performed at [facility], [address] [place], [place] [address]  CBC     Status: Abnormal   Collection Time: 01/28/19 12:13 PM  Result Value Ref Range   WBC 7.3 4.0 - 10.5 K/uL   RBC 5.20 (H) 3.87 - 5.11 MIL/uL   Hemoglobin 15.7 (H) 12.0 - 15.0 g/dL  HCT 42.5 36.0 - 46.0 %   MCV 81.7 80.0 - 100.0 fL   MCH 30.2 26.0 - 34.0 pg   MCHC 36.9 (H) 30.0 - 36.0 g/dL   RDW 40.911.8 81.111.5 - 91.415.5 %   Platelets 308 150 - 400 K/uL   nRBC 0.0 0.0 - 0.2 %    Comment: Performed at [facility], [address] [place], [place] [address]  CBC     Status: Abnormal   Collection Time: 01/28/19  7:29 PM  Result Value Ref Range   WBC 7.7 4.0 - 10.5 K/uL   RBC 4.91 3.87 - 5.11 MIL/uL   Hemoglobin 14.7 12.0 - 15.0 g/dL   HCT 62.140.5 30.836.0 - 65.746.0 %   MCV 82.5 80.0 - 100.0 fL   MCH 29.9 26.0 - 34.0 pg   MCHC 36.3 (H) 30.0 - 36.0 g/dL   RDW 84.611.8 96.211.5 - 95.215.5 %   Platelets 307 150 - 400 K/uL   nRBC 0.0 0.0 - 0.2 %    Comment: Performed at [facility], [address] [place], [place] [address]  Creatinine, serum     Status: Abnormal   Collection Time: 01/28/19  7:29 PM  Result  Value Ref Range   Creatinine, Ser 0.88 0.44 - 1.00 mg/dL   GFR calc non Af Amer 57 (L) >60 mL/min   GFR calc Af Amer >60 >60 mL/min    Comment: Performed at [facility], [address] [place], [place] [address]  Sodium     Status: Abnormal   Collection Time: 01/28/19  7:29 PM  Result Value Ref Range   Sodium 114 (LL) 135 - 145 mmol/L    Comment: CRITICAL RESULT CALLED TO, READ BACK BY AND VERIFIED WITH ASHLEY SMITH @ 2001 ON 01/28/19 BY JUW Performed at [facility], [address] [place], [place] [address]   Urinalysis, Complete w Microscopic     Status: Abnormal   Collection Time: 01/28/19  9:12 PM  Result Value Ref Range   Color, Urine YELLOW (A) YELLOW   APPearance CLEAR (A) CLEAR   Specific Gravity, Urine 1.015 1.005 - 1.030   pH 6.0 5.0 - 8.0   Glucose, UA NEGATIVE NEGATIVE mg/dL   Hgb urine dipstick NEGATIVE NEGATIVE   Bilirubin Urine NEGATIVE NEGATIVE   Ketones, ur 5 (A) NEGATIVE mg/dL   Protein, ur NEGATIVE NEGATIVE mg/dL   Nitrite NEGATIVE NEGATIVE   Leukocytes,Ua NEGATIVE NEGATIVE   RBC / HPF 0-5 0 - 5 RBC/hpf   WBC, UA 0-5 0 - 5 WBC/hpf   Bacteria, UA NONE SEEN NONE SEEN   Squamous Epithelial / LPF 0-5 0 - 5   Mucus PRESENT     Comment: Performed at [facility], [address] [place], [place] [address]  Osmolality, urine     Status: None   Collection Time: 01/28/19  9:12 PM  Result Value Ref Range   Osmolality, Ur 501 300 - 900 mOsm/kg    Comment: Performed at [facility], [address] [place], [place] [address]  Sodium, urine, random     Status: None   Collection Time: 01/28/19  9:12 PM  Result Value Ref Range   Sodium, Ur <10 mmol/L    Comment: Performed at [facility], [address] [place], [place] [address]  Sodium     Status: Abnormal   Collection Time: 01/29/19  1:38 AM  Result Value Ref Range   Sodium 117 (LL) 135 - 145 mmol/L  Comment: CRITICAL RESULT CALLED TO, READ BACK BY AND  VERIFIED WITH ALLY BOWMAN @0238  01/29/19 AKT Performed at [facility], [address] [place], [place] [address]   Basic metabolic panel     Status: Abnormal   Collection Time: 01/29/19  5:09 AM  Result Value Ref Range   Sodium 119 (LL) 135 - 145 mmol/L    Comment: CRITICAL RESULT CALLED TO, READ BACK BY AND VERIFIED WITH ALLISON BOWMAN @558  01/29/2019 TTG    Potassium 3.0 (L) 3.5 - 5.1 mmol/L   Chloride 83 (L) 98 - 111 mmol/L   CO2 28 22 - 32 mmol/L   Glucose, Bld 101 (H) 70 - 99 mg/dL   BUN 19 8 - 23 mg/dL   Creatinine, Ser 6.28 0.44 - 1.00 mg/dL   Calcium 7.9 (L) 8.9 - 10.3 mg/dL   GFR calc non Af Amer >60 >60 mL/min   GFR calc Af Amer >60 >60 mL/min   Anion gap 8 5 - 15    Comment: Performed at [facility], [address] [place], [place] [address]  TSH     Status: None   Collection Time: 01/29/19  5:09 AM  Result Value Ref Range   TSH 1.498 0.350 - 4.500 uIU/mL    Comment: Performed by a 3rd Generation assay with a functional sensitivity of <=0.01 uIU/mL. Performed at [facility], [address] [place], [place] [address]    Dg Chest 2 View  Result Date: 01/28/2019 CLINICAL DATA:  Cough and congestion. EXAM: CHEST - 2 VIEW COMPARISON:  None. FINDINGS: The heart is mildly enlarged. There is moderate tortuosity and calcification of the thoracic aorta. The lungs are clear of an acute process. No worrisome pulmonary lesions. No pleural effusion. The bony thorax is intact. Remote healed rib fractures are noted and there is also a remote appearing compression fracture of T8. IMPRESSION: No acute cardiopulmonary findings. Electronically Signed   By: Rudie Meyer M.D.   On: 01/28/2019 12:36    Pending Labs Unresulted Labs (From admission, onward)    Start     Ordered   02/04/19 0500  Creatinine, serum  (enoxaparin (LOVENOX)    CrCl >/= 30 ml/min)  Weekly,   STAT    Comments:  while on enoxaparin therapy    01/28/19 1352   01/29/19 0500  Protein  electrophoresis, serum  Tomorrow morning,   STAT     01/28/19 1802   01/29/19 0500  Kappa/lambda light chains  Tomorrow morning,   STAT     01/28/19 1802   01/28/19 1353  Sodium  Now then every 6 hours,   STAT     01/28/19 1353          Vitals/Pain Today's Vitals   01/29/19 0815 01/29/19 0830 01/29/19 0845 01/29/19 0900  BP:    (!) 158/84  Pulse: (!) 56 (!) 58 60 63  Resp: 14 16 (!) 22 17  Temp:      TempSrc:      SpO2: 95% 95% 94% 94%  Weight:      PainSc:        Isolation Precautions No active isolations  Medications Medications  enoxaparin (LOVENOX) injection 40 mg (40 mg Subcutaneous Not Given 01/28/19 2313)  acetaminophen (TYLENOL) tablet 650 mg (has no administration in time range)    Or  acetaminophen (TYLENOL) suppository 650 mg (has no administration in time range)  0.9 %  sodium chloride infusion ( Intravenous Transfusing/Transfer 01/29/19 0903)  ondansetron (ZOFRAN)  tablet 4 mg (has no administration in time range)    Or  ondansetron (ZOFRAN) injection 4 mg (has no administration in time range)  senna-docusate (Senokot-S) tablet 1 tablet (has no administration in time range)  doxycycline (VIBRA-TABS) tablet 100 mg (100 mg Oral Given 01/28/19 2310)  aspirin EC tablet 81 mg (81 mg Oral Not Given 01/28/19 1500)  simvastatin (ZOCOR) tablet 10 mg (10 mg Oral Given 01/28/19 2310)  atenolol (TENORMIN) tablet 75 mg (75 mg Oral Given 01/28/19 2310)  labetalol (NORMODYNE,TRANDATE) injection 10 mg (10 mg Intravenous Given 01/29/19 0142)  potassium chloride SA (K-DUR,KLOR-CON) CR tablet 40 mEq (40 mEq Oral Given 01/28/19 1505)    Mobility walks with person assist Moderate fall risk  Focused Assessments Neuro Assessment Handoff:  Swallow screen pass? Yes          Neuro Assessment: Exceptions to WDL(Patient pulled out one of her IV accesses and  took off her hospital bracelet and has been attempting to get up out of bed. Pt is easily redirected. ) Neuro Checks:       Last Documented NIHSS Modified Score:   Has TPA been given? No If patient is a Neuro Trauma and patient is going to OR before floor call report to 4N Charge nurse: [phone] or [phone]     R Recommendations: See Admitting Provider Note  Report given to: Bradd Canary., RN   Additional Notes:

## 2019-01-29 NOTE — ED Notes (Signed)
Pts repeat sodium level has been collected. VS reassessed, BP improved following IV labetalol. Pt resting, remains on cardiac monitor, call bell within reach.

## 2019-01-29 NOTE — ED Notes (Signed)
Pt witnessed ambulating to the bathroom without assistance,  RN went into the room to assist pt and noted her IV had been unintentionally removed by the pt when she was trying to get up. IV site cleaned up, bleeding controlled and dressing applied. Pt assisted to the bathroom. Gown and brief changed. Pt back in bed at this time. Bed alarm reset. New IV access had been obtained and Kerlex applied over IV to prevent pt from removing IV. Call light within reach.

## 2019-01-29 NOTE — Progress Notes (Signed)
PT Cancellation Note  Patient Details Name: Barbara Lara MRN: [id number] DOB: September 14, 1925   Cancelled Treatment:    Reason Eval/Treat Not Completed: Medical issues which prohibited therapy. Per chart review pt's Na 119 and exercise is contraindicated. Will hold therapy until Na levels improve. Continue to follow acutely.    Emeline Gins, SPT  01/29/2019, 8:56 AM

## 2019-01-29 NOTE — Progress Notes (Signed)
[employer] [facility] [facility] [place] at [facility]   PATIENT NAME: Barbara Lara    MR#:  [id number]  DATE OF BIRTH:  [birth year]  SUBJECTIVE:  CHIEF COMPLAINT:   Chief Complaint  Patient presents with  . Cough  . Shortness of Breath    Weak, confused- noted hyponatremia.  REVIEW OF SYSTEMS:  CONSTITUTIONAL: No fever, have fatigue or weakness.  EYES: No blurred or double vision.  EARS, NOSE, AND THROAT: No tinnitus or ear pain.  RESPIRATORY: No cough, shortness of breath, wheezing or hemoptysis.  CARDIOVASCULAR: No chest pain, orthopnea, edema.  GASTROINTESTINAL: No nausea, vomiting, diarrhea or abdominal pain.  GENITOURINARY: No dysuria, hematuria.  ENDOCRINE: No polyuria, nocturia,  HEMATOLOGY: No anemia, easy bruising or bleeding SKIN: No rash or lesion. MUSCULOSKELETAL: No joint pain or arthritis.   NEUROLOGIC: No tingling, numbness, weakness.  PSYCHIATRY: No anxiety or depression.   ROS  DRUG ALLERGIES:   Allergies  Allergen Reactions  . Avelox [Moxifloxacin Hcl In Nacl]     Hallucinations  . Lisinopril     Facial edema   . Penicillins     unknown    VITALS:  Blood pressure (!) 182/80, pulse 60, temperature (!) 97.3 F (36.3 C), temperature source Oral, resp. rate (!) 24, weight 44.5 kg, SpO2 97 %.  PHYSICAL EXAMINATION:  GENERAL:  85 y.o.-year-old patient lying in the bed with no acute distress.  EYES: Pupils equal, round, reactive to light and accommodation. No scleral icterus. Extraocular muscles intact.  HEENT: Head atraumatic, normocephalic. Oropharynx and nasopharynx clear.  NECK:  Supple, no jugular venous distention. No thyroid enlargement, no tenderness.  LUNGS: Normal breath sounds bilaterally, no wheezing, rales,rhonchi or crepitation. No use of accessory muscles of respiration.  CARDIOVASCULAR: S1, S2 normal. No murmurs, rubs, or gallops.  ABDOMEN: Soft, nontender, nondistended. Bowel sounds present. No organomegaly or mass.  EXTREMITIES: No  pedal edema, cyanosis, or clubbing.  NEUROLOGIC: Cranial nerves II through XII are intact. Muscle strength 4/5 in all extremities. Sensation intact. Gait not checked.  PSYCHIATRIC: The patient is alert and oriented x 3.  SKIN: No obvious rash, lesion, or ulcer.   Physical Exam LABORATORY PANEL:   CBC Recent Labs  Lab 01/28/19 1929  WBC 7.7  HGB 14.7  HCT 40.5  PLT 307   ------------------------------------------------------------------------------------------------------------------  Chemistries  Recent Labs  Lab 01/29/19 0509 01/29/19 0949  NA 119* 119*  K 3.0*  --   CL 83*  --   CO2 28  --   GLUCOSE 101*  --   BUN 19  --   CREATININE 0.65  --   CALCIUM 7.9*  --   MG 1.9  --    ------------------------------------------------------------------------------------------------------------------  Cardiac Enzymes No results for input(s): TROPONINI in the last 168 hours. ------------------------------------------------------------------------------------------------------------------  RADIOLOGY:  Dg Chest 2 View  Result Date: 01/28/2019 CLINICAL DATA:  Cough and congestion. EXAM: CHEST - 2 VIEW COMPARISON:  None. FINDINGS: The heart is mildly enlarged. There is moderate tortuosity and calcification of the thoracic aorta. The lungs are clear of an acute process. No worrisome pulmonary lesions. No pleural effusion. The bony thorax is intact. Remote healed rib fractures are noted and there is also a remote appearing compression fracture of T8. IMPRESSION: No acute cardiopulmonary findings. Electronically Signed   By: Rudie Meyer M.D.   On: 01/28/2019 12:36    ASSESSMENT AND PLAN:   Active Problems:   Hyponatremia  85 year old female who presents from home due to generalized weakness and cough and found  to have a sodium level of 114.  1.  Symptomatic hyponatremia: Appreciate help by nephrology, gentle IV hydration to correct slowly.  Sodium level every 6 hours- improved  some Checked TSH Stop hydrochlorothiazide  2.  Essential hypertension: Continue atenolol and stop hydrochlorothiazide 3.  Hypokalemia from poor p.o. intake: Replace and recheck in a.m.  4.  Acute bronchitis-doxycycline.    All the records are reviewed and case discussed with Care Management/Social Workerr. Management plans discussed with the patient, family and they are in agreement.  CODE STATUS: DNR.  TOTAL TIME TAKING CARE OF THIS PATIENT: 35 minutes.   POSSIBLE D/C IN 1-2 DAYS, DEPENDING ON CLINICAL CONDITION.   Altamese Dilling M.D on 01/29/2019   Between 7am to 6pm - Pager - [phone]  After 6pm go to [email or web address] - password [email or web address]  [facility] [facility]  Office  [phone]  CC: Primary care physician; [facility]  Note: This dictation was prepared with Dragon dictation along with smaller phrase technology. Any transcriptional errors that result from this process are unintentional.
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
## 2019-01-29 NOTE — ED Notes (Signed)
NA at bedside to assist pt to bathroom.

## 2019-01-29 NOTE — ED Notes (Addendum)
Pt pulled out 1 of her IV accesses and removed her ID bracelet. Has been attmepting to get out of bed. IV site bleeding controlled. Id bracelet replaced. Fluids switched to remaining IV access. Pt has been placed in hospital bed, gown changed, assisted to the bathroom, and provided with warm blankets. Reoriented to call bell. Bed in low and locked position. VS assessed and BP running high, PRN labetalol in place and will be administered.

## 2019-01-30 LAB — BASIC METABOLIC PANEL
Anion gap: 5 (ref 5–15)
BUN: 15 mg/dL (ref 8–23)
CO2: 27 mmol/L (ref 22–32)
Calcium: 8.2 mg/dL — ABNORMAL LOW (ref 8.9–10.3)
Chloride: 94 mmol/L — ABNORMAL LOW (ref 98–111)
Creatinine, Ser: 0.65 mg/dL (ref 0.44–1.00)
GFR calc Af Amer: >60 mL/min (ref >60)
GFR calc non Af Amer: >60 mL/min (ref >60)
Glucose, Bld: 93 mg/dL (ref 70–99)
POTASSIUM: 3.7 mmol/L (ref 3.5–5.1)
Sodium: 126 mmol/L — ABNORMAL LOW (ref 135–145)

## 2019-01-30 LAB — SODIUM: Sodium: 129 mmol/L — ABNORMAL LOW (ref 135–145)

## 2019-01-30 MED ORDER — GUAIFENESIN 100 MG/5ML PO SOLN
5.0000 mL | ORAL | Status: DC | PRN
  Filled 2019-01-30 (×3): qty 5

## 2019-01-30 MED ORDER — ENOXAPARIN SODIUM 30 MG/0.3ML ~~LOC~~ SOLN
30.0000 mg | SUBCUTANEOUS | Status: DC
  Administered 2019-01-30 – 2019-02-01 (×3): 30 mg via SUBCUTANEOUS
  Filled 2019-01-30 (×3): qty 0.3

## 2019-01-30 MED ORDER — BISACODYL 5 MG PO TBEC: 5.0000 mg | DELAYED_RELEASE_TABLET | Freq: Every day | ORAL | Status: DC | PRN

## 2019-01-30 NOTE — Progress Notes (Signed)
Anticoagulation monitoring(Lovenox):  85yo  female ordered Lovenox 40 mg Q24h for DVT prevention  Filed Weights   01/28/19 1209 01/29/19 1208  Weight: 98 lb 1.7 oz (44.5 kg) 98 lb 1.7 oz (44.5 kg)   BMI     Lab Results  Component Value Date   CREATININE 0.65 01/30/2019   CREATININE 0.65 01/29/2019   CREATININE 0.88 01/28/2019   Estimated Creatinine Clearance: 32.2 mL/min (by C-G formula based on SCr of 0.65 mg/dL). Hemoglobin & Hematocrit     Component Value Date/Time   HGB 14.7 01/28/2019 1929   HCT 40.5 01/28/2019 1929     Per Protocol for Patient with estCrcl > 30 ml/min and weight < 45 kg0, will transition to Lovenox 30 mg Q24h.     Clovia Cuff, PharmD, BCPS 01/30/2019 10:14 AM

## 2019-01-30 NOTE — Progress Notes (Addendum)
[employer] [facility] - [place] at [facility]   PATIENT NAME: Barbara Lara    MR#:  [id number]  DATE OF BIRTH:  [birth year]  SUBJECTIVE:  CHIEF COMPLAINT:   Chief Complaint  Patient presents with  . Cough  . Shortness of Breath   The patient has some cough and generalized weakness. REVIEW OF SYSTEMS:  CONSTITUTIONAL: No fever, have fatigue or weakness.  EYES: No blurred or double vision.  EARS, NOSE, AND THROAT: No tinnitus or ear pain.  RESPIRATORY: has cough, no shortness of breath, wheezing or hemoptysis.  CARDIOVASCULAR: No chest pain, orthopnea, edema.  GASTROINTESTINAL: No nausea, vomiting, diarrhea or abdominal pain.  GENITOURINARY: No dysuria, hematuria.  ENDOCRINE: No polyuria, nocturia,  HEMATOLOGY: No anemia, easy bruising or bleeding SKIN: No rash or lesion. MUSCULOSKELETAL: No joint pain or arthritis.   NEUROLOGIC: No tingling, numbness, weakness.  PSYCHIATRY: No anxiety or depression.   ROS  DRUG ALLERGIES:   Allergies  Allergen Reactions  . Avelox [Moxifloxacin Hcl In Nacl]     Hallucinations  . Lisinopril     Facial edema   . Penicillins     unknown    VITALS:  Blood pressure (!) 146/81, pulse 66, temperature 98.2 F (36.8 C), temperature source Oral, resp. rate 16, height 5\' 5"  (1.651 m), weight 44.5 kg, SpO2 98 %.  PHYSICAL EXAMINATION:  GENERAL:  85 y.o.-year-old patient lying in the bed with no acute distress.  EYES: Pupils equal, round, reactive to light and accommodation. No scleral icterus. Extraocular muscles intact.  HEENT: Head atraumatic, normocephalic. Oropharynx and nasopharynx clear.  NECK:  Supple, no jugular venous distention. No thyroid enlargement, no tenderness.  LUNGS: Coarse sounds bilaterally, no wheezing, rales,rhonchi or crepitation. No use of accessory muscles of respiration.  CARDIOVASCULAR: S1, S2 normal. No murmurs, rubs, or gallops.  ABDOMEN: Soft, nontender, nondistended. Bowel sounds present. No organomegaly  or mass.  EXTREMITIES: No pedal edema, cyanosis, or clubbing.  NEUROLOGIC: Cranial nerves II through XII are intact. Muscle strength 4/5 in all extremities. Sensation intact. Gait not checked.  PSYCHIATRIC: The patient is alert and oriented x 3.  SKIN: No obvious rash, lesion, or ulcer.   Physical Exam LABORATORY PANEL:   CBC Recent Labs  Lab 01/28/19 1929  WBC 7.7  HGB 14.7  HCT 40.5  PLT 307   ------------------------------------------------------------------------------------------------------------------  Chemistries  Recent Labs  Lab 01/29/19 0509  01/30/19 0155 01/30/19 0758  NA 119*   < > 126* 129*  K 3.0*  --  3.7  --   CL 83*  --  94*  --   CO2 28  --  27  --   GLUCOSE 101*  --  93  --   BUN 19  --  15  --   CREATININE 0.65  --  0.65  --   CALCIUM 7.9*  --  8.2*  --   MG 1.9  --   --   --    < > = values in this interval not displayed.   ------------------------------------------------------------------------------------------------------------------  Cardiac Enzymes No results for input(s): TROPONINI in the last 168 hours. ------------------------------------------------------------------------------------------------------------------  RADIOLOGY:  Dg Chest Port 1 View  Result Date: 01/29/2019 CLINICAL DATA:  Cough EXAM: PORTABLE CHEST 1 VIEW COMPARISON:  01/28/2019 FINDINGS: No focal airspace disease or effusion. Stable mild cardiomegaly with aortic atherosclerosis. No pneumothorax. Mild right apical pleural calcification. IMPRESSION: No active disease.  Mild cardiomegaly. Electronically Signed   By: Jasmine Pang M.D.   On: 01/29/2019 21:48  ASSESSMENT AND PLAN:   Active Problems:   Hyponatremia  85 year old female who presents from home due to generalized weakness and cough and found to have a sodium level of 114.  1.  Symptomatic hyponatremia: Appreciate help by nephrology, Improving with normal saline IV, hold  hydrochlorothiazide. Discontinue normal saline IV and follow-up BMP.  2.  Essential hypertension: Continue atenolol and hold hydrochlorothiazide 3.  Hypokalemia from poor p.o. intake: Improved with potassium supplement. 4.  Acute bronchitis-on doxycycline.  Generalized weakness.  PT evaluation suggest home health and PT. All the records are reviewed and case discussed with Care Management/Social Workerr. Management plans discussed with the patient, family and they are in agreement.  CODE STATUS: DNR.  TOTAL TIME TAKING CARE OF THIS PATIENT: 33 minutes.   POSSIBLE D/C IN 1-2 DAYS, DEPENDING ON CLINICAL CONDITION.   Shaune Pollack M.D on 01/30/2019   Between 7am to 6pm - Pager - [phone]  After 6pm go to [email or web address] - password [email or web address]  [facility] [place] [facility]  Office  [phone]  CC: Primary care physician; [facility]  Note: This dictation was prepared with Dragon dictation along with smaller phrase technology. Any transcriptional errors that result from this process are unintentional.
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
## 2019-01-31 LAB — SODIUM: Sodium: 128 mmol/L — ABNORMAL LOW (ref 135–145)

## 2019-01-31 LAB — SODIUM, URINE, RANDOM: Sodium, Ur: 36 mmol/L

## 2019-01-31 LAB — OSMOLALITY, URINE: Osmolality, Ur: 190 mOsm/kg — ABNORMAL LOW (ref 300–900)

## 2019-01-31 MED ORDER — GUAIFENESIN ER 600 MG PO TB12
600.0000 mg | ORAL_TABLET | Freq: Two times a day (BID) | ORAL | Status: DC
  Administered 2019-01-31 – 2019-02-03 (×7): 600 mg via ORAL
  Filled 2019-01-31 (×7): qty 1

## 2019-01-31 MED ORDER — SODIUM CHLORIDE 1 G PO TABS
1.0000 g | ORAL_TABLET | Freq: Three times a day (TID) | ORAL | Status: DC
  Administered 2019-01-31 – 2019-02-03 (×9): 1 g via ORAL
  Filled 2019-01-31 (×11): qty 1

## 2019-01-31 NOTE — Care Management Note (Signed)
Case Management Note  Patient Details  Name: Barbara Lara MRN: [id number] Date of Birth: [birth year]  Subjective/Objective:   RNCM consulted on patient in regards to a PT recommendation of home health. Patient appears to have intermittent confusion but tells me she lives with her son and that he takes care of her needs. I have contacted son Peggie Shines [phone] to discuss home health. Cyndra Numbers has already left for the day and "prefers to talk about this in person". Cyndra Numbers has left for the day and plans to be back tomorrow to discuss what home health entails. PT suggests a walker but patient has rolling walker in the home. PCP is Lucy Chris with [facility]. Able to obtain medications without concern from [facility].                    Action/Plan: Will discuss HH options and necessity with son tomorrow. [facility] to [facility] if son agreeable and without preference.  Expected Discharge Date:                  Expected Discharge Plan:     In-House Referral:     Discharge planning Services  CM Consult  Post Acute Care Choice:  Home Health Choice offered to:  Patient, Adult Children  DME Arranged:    DME Agency:     HH Arranged:    HH Agency:     Status of Service:     If discussed at [employer] of Stay Meetings, dates discussed:    Additional Comments:  Virgel Manifold, RN 01/31/2019, 3:47 PM
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
## 2019-01-31 NOTE — Progress Notes (Addendum)
[employer] [facility] [facility] [place] at [facility]   PATIENT NAME: Barbara Lara    MR#:  [id number]  DATE OF BIRTH:  [birth year]  SUBJECTIVE:  CHIEF COMPLAINT:   Chief Complaint  Patient presents with  . Cough  . Shortness of Breath   The patient has some cough and generalized weakness. REVIEW OF SYSTEMS:  CONSTITUTIONAL: No fever, have fatigue or weakness.  EYES: No blurred or double vision.  EARS, NOSE, AND THROAT: No tinnitus or ear pain.  RESPIRATORY: has cough, no shortness of breath, wheezing or hemoptysis.  CARDIOVASCULAR: No chest pain, orthopnea, edema.  GASTROINTESTINAL: No nausea, vomiting, diarrhea or abdominal pain.  GENITOURINARY: No dysuria, hematuria.  ENDOCRINE: No polyuria, nocturia,  HEMATOLOGY: No anemia, easy bruising or bleeding SKIN: No rash or lesion. MUSCULOSKELETAL: No joint pain or arthritis.   NEUROLOGIC: No tingling, numbness, weakness.  PSYCHIATRY: No anxiety or depression.   ROS  DRUG ALLERGIES:   Allergies  Allergen Reactions  . Avelox [Moxifloxacin Hcl In Nacl]     Hallucinations  . Lisinopril     Facial edema   . Penicillins     unknown    VITALS:  Blood pressure (!) 168/81, pulse 65, temperature (!) 97.5 F (36.4 C), temperature source Oral, resp. rate 20, height 5\' 5"  (1.651 m), weight 44.5 kg, SpO2 98 %.  PHYSICAL EXAMINATION:  GENERAL:  85 y.o.-year-old patient lying in the bed with no acute distress.  EYES: Pupils equal, round, reactive to light and accommodation. No scleral icterus. Extraocular muscles intact.  HEENT: Head atraumatic, normocephalic. Oropharynx and nasopharynx clear.  NECK:  Supple, no jugular venous distention. No thyroid enlargement, no tenderness.  LUNGS: Coarse sounds bilaterally, no wheezing, rales,rhonchi or crepitation. No use of accessory muscles of respiration.  CARDIOVASCULAR: S1, S2 normal. No murmurs, rubs, or gallops.  ABDOMEN: Soft, nontender, nondistended. Bowel sounds present. No  organomegaly or mass.  EXTREMITIES: No pedal edema, cyanosis, or clubbing.  NEUROLOGIC: Cranial nerves II through XII are intact. Muscle strength 4/5 in all extremities. Sensation intact. Gait not checked.  PSYCHIATRIC: The patient is alert and oriented x 3.  SKIN: No obvious rash, lesion, or ulcer.   Physical Exam LABORATORY PANEL:   CBC Recent Labs  Lab 01/28/19 1929  WBC 7.7  HGB 14.7  HCT 40.5  PLT 307   ------------------------------------------------------------------------------------------------------------------  Chemistries  Recent Labs  Lab 01/29/19 0509  01/30/19 0155  01/31/19 0401  NA 119*   < > 126*   < > 128*  K 3.0*  --  3.7  --   --   CL 83*  --  94*  --   --   CO2 28  --  27  --   --   GLUCOSE 101*  --  93  --   --   BUN 19  --  15  --   --   CREATININE 0.65  --  0.65  --   --   CALCIUM 7.9*  --  8.2*  --   --   MG 1.9  --   --   --   --    < > = values in this interval not displayed.   ------------------------------------------------------------------------------------------------------------------  Cardiac Enzymes No results for input(s): TROPONINI in the last 168 hours. ------------------------------------------------------------------------------------------------------------------  RADIOLOGY:  Dg Chest Port 1 View  Result Date: 01/29/2019 CLINICAL DATA:  Cough EXAM: PORTABLE CHEST 1 VIEW COMPARISON:  01/28/2019 FINDINGS: No focal airspace disease or effusion. Stable mild cardiomegaly with aortic atherosclerosis.  No pneumothorax. Mild right apical pleural calcification. IMPRESSION: No active disease.  Mild cardiomegaly. Electronically Signed   By: Jasmine Pang M.D.   On: 01/29/2019 21:48    ASSESSMENT AND PLAN:   Active Problems:   Hyponatremia  85 year old female who presents from home due to generalized weakness and cough and found to have a sodium level of 114.  1.  Symptomatic hyponatremia: Appreciate help by nephrology, Improving  with normal saline IV, hold hydrochlorothiazide. Discontinued normal saline IV due to concerns of congestion. Na is low at 128, start salt tab and follow-up BMP.  2.  Essential hypertension: Continue atenolol and hold hydrochlorothiazide 3.  Hypokalemia from poor p.o. intake: Improved with potassium supplement. 4.  Acute bronchitis-on doxycycline.  Generalized weakness.  PT evaluation suggest home health and PT. All the records are reviewed and case discussed with Care Management/Social Workerr. Management plans discussed with the patient, her son and they are in agreement.  CODE STATUS: DNR.  TOTAL TIME TAKING CARE OF THIS PATIENT: 28 minutes.   POSSIBLE D/C IN 1-2 DAYS, DEPENDING ON CLINICAL CONDITION.   Shaune Pollack M.D on 01/31/2019   Between 7am to 6pm - Pager - [phone]  After 6pm go to [email or web address] - password [email or web address]  [facility] [place]  Office  [phone]  CC: Primary care physician; [facility]  Note: This dictation was prepared with Dragon dictation along with smaller phrase technology. Any transcriptional errors that result from this process are unintentional.

## 2019-02-01 LAB — BASIC METABOLIC PANEL
ANION GAP: 10 (ref 5–15)
BUN: 14 mg/dL (ref 8–23)
CO2: 25 mmol/L (ref 22–32)
Calcium: 9.2 mg/dL (ref 8.9–10.3)
Chloride: 95 mmol/L — ABNORMAL LOW (ref 98–111)
Creatinine, Ser: 0.68 mg/dL (ref 0.44–1.00)
GFR calc non Af Amer: >60 mL/min (ref >60)
Glucose, Bld: 116 mg/dL — ABNORMAL HIGH (ref 70–99)
Potassium: 4.4 mmol/L (ref 3.5–5.1)
Sodium: 130 mmol/L — ABNORMAL LOW (ref 135–145)

## 2019-02-01 LAB — KAPPA/LAMBDA LIGHT CHAINS
Kappa free light chain: 9.7 mg/L (ref 3.3–19.4)
Kappa, lambda light chain ratio: 0.68 (ref 0.26–1.65)
Lambda free light chains: 14.3 mg/L (ref 5.7–26.3)

## 2019-02-01 LAB — PROTEIN ELECTROPHORESIS, SERUM
A/G Ratio: 1.6 (ref 0.7–1.7)
ALPHA-2-GLOBULIN: 0.5 g/dL (ref 0.4–1.0)
Albumin ELP: 3.4 g/dL (ref 2.9–4.4)
Alpha-1-Globulin: 0.2 g/dL (ref 0.0–0.4)
BETA GLOBULIN: 0.7 g/dL (ref 0.7–1.3)
Gamma Globulin: 0.7 g/dL (ref 0.4–1.8)
Globulin, Total: 2.1 g/dL — ABNORMAL LOW (ref 2.2–3.9)
Total Protein ELP: 5.5 g/dL — ABNORMAL LOW (ref 6.0–8.5)

## 2019-02-01 MED ORDER — ENSURE ENLIVE PO LIQD
237.0000 mL | Freq: Two times a day (BID) | ORAL | Status: DC
  Administered 2019-02-01 – 2019-02-02 (×3): 237 mL via ORAL

## 2019-02-01 MED ORDER — ADULT MULTIVITAMIN W/MINERALS CH
1.0000 | ORAL_TABLET | Freq: Every day | ORAL | Status: DC
  Administered 2019-02-02 – 2019-02-03 (×2): 1 via ORAL
  Filled 2019-02-01 (×2): qty 1

## 2019-02-01 NOTE — Care Management Important Message (Signed)
Copy of signed Medicare IM left with patient in room. 

## 2019-02-01 NOTE — Progress Notes (Signed)
[employer] - [place] at [facility]   PATIENT NAME: Maricia Mesmer    MR#:  [id number]  DATE OF BIRTH:  12/31/1924  SUBJECTIVE:  CHIEF COMPLAINT:   Chief Complaint  Patient presents with  . Cough  . Shortness of Breath   The patient has better cough and generalized weakness. REVIEW OF SYSTEMS:  CONSTITUTIONAL: No fever, have fatigue or weakness.  EYES: No blurred or double vision.  EARS, NOSE, AND THROAT: No tinnitus or ear pain.  RESPIRATORY: has cough, no shortness of breath, wheezing or hemoptysis.  CARDIOVASCULAR: No chest pain, orthopnea, edema.  GASTROINTESTINAL: No nausea, vomiting, diarrhea or abdominal pain.  GENITOURINARY: No dysuria, hematuria.  ENDOCRINE: No polyuria, nocturia,  HEMATOLOGY: No anemia, easy bruising or bleeding SKIN: No rash or lesion. MUSCULOSKELETAL: No joint pain or arthritis.   NEUROLOGIC: No tingling, numbness, weakness.  PSYCHIATRY: No anxiety or depression.   ROS  DRUG ALLERGIES:   Allergies  Allergen Reactions  . Avelox [Moxifloxacin Hcl In Nacl]     Hallucinations  . Lisinopril     Facial edema   . Penicillins     unknown    VITALS:  Blood pressure (!) 149/77, pulse 65, temperature 98.5 F (36.9 C), temperature source Oral, resp. rate 15, height 5\' 5"  (1.651 m), weight 44.5 kg, SpO2 96 %.  PHYSICAL EXAMINATION:  GENERAL:  85 y.o.-year-old patient lying in the bed with no acute distress.  Severe malnutrition. EYES: Pupils equal, round, reactive to light and accommodation. No scleral icterus. Extraocular muscles intact.  HEENT: Head atraumatic, normocephalic. Oropharynx and nasopharynx clear.  NECK:  Supple, no jugular venous distention. No thyroid enlargement, no tenderness.  LUNGS: Better coarse sounds bilaterally, no wheezing, rales,rhonchi or crepitation. No use of accessory muscles of respiration.  CARDIOVASCULAR: S1, S2 normal. No murmurs, rubs, or gallops.  ABDOMEN: Soft, nontender, nondistended. Bowel  sounds present. No organomegaly or mass.  EXTREMITIES: No pedal edema, cyanosis, or clubbing.  NEUROLOGIC: Cranial nerves II through XII are intact. Muscle strength 4/5 in all extremities. Sensation intact. Gait not checked.  PSYCHIATRIC: The patient is alert and oriented x 3.  SKIN: No obvious rash, lesion, or ulcer.   Physical Exam LABORATORY PANEL:   CBC Recent Labs  Lab 01/28/19 1929  WBC 7.7  HGB 14.7  HCT 40.5  PLT 307   ------------------------------------------------------------------------------------------------------------------  Chemistries  Recent Labs  Lab 01/29/19 0509  02/01/19 0532  NA 119*   < > 130*  K 3.0*   < > 4.4  CL 83*   < > 95*  CO2 28   < > 25  GLUCOSE 101*   < > 116*  BUN 19   < > 14  CREATININE 0.65   < > 0.68  CALCIUM 7.9*   < > 9.2  MG 1.9  --   --    < > = values in this interval not displayed.   ------------------------------------------------------------------------------------------------------------------  Cardiac Enzymes No results for input(s): TROPONINI in the last 168 hours. ------------------------------------------------------------------------------------------------------------------  RADIOLOGY:  No results found.  ASSESSMENT AND PLAN:   Active Problems:   Hyponatremia  85 year old female who presents from home due to generalized weakness and cough and found to have a sodium level of 114.  1.  Symptomatic hyponatremia: Appreciate help by nephrology, Improving with normal saline IV, hold hydrochlorothiazide. Discontinued normal saline IV due to concerns of congestion. Na is low at 128, start salt tab and follow-up BMP.  2.  Essential hypertension: Continue atenolol and hold  hydrochlorothiazide 3.  Hypokalemia from poor p.o. intake: Improved with potassium supplement. 4.  Acute bronchitis-completed doxycycline. Severe malnutrition.  Follow dietitian's recommendation. Generalized weakness.  PT evaluation suggest  home health and PT. All the records are reviewed and case discussed with Care Management/Social Workerr. Management plans discussed with the patient, her son and they are in agreement.  CODE STATUS: DNR.  TOTAL TIME TAKING CARE OF THIS PATIENT: 27 minutes.   POSSIBLE D/C IN 1-2 DAYS, DEPENDING ON CLINICAL CONDITION.   Shaune Pollack M.D on 02/01/2019   Between 7am to 6pm - Pager - [phone]  After 6pm go to [email or web address] - password [email or web address]  [facility] [place] [facility]  Office  [phone]  CC: Primary care physician; [facility]  Note: This dictation was prepared with Dragon dictation along with smaller phrase technology. Any transcriptional errors that result from this process are unintentional.
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
## 2019-02-02 LAB — SODIUM: Sodium: 129 mmol/L — ABNORMAL LOW (ref 135–145)

## 2019-02-02 MED ORDER — ENOXAPARIN SODIUM 30 MG/0.3ML ~~LOC~~ SOLN
30.0000 mg | SUBCUTANEOUS | Status: DC
  Administered 2019-02-02: 30 mg via SUBCUTANEOUS
  Filled 2019-02-02: qty 0.3

## 2019-02-02 NOTE — Progress Notes (Signed)
[facility]   PATIENT NAME: Barbara Lara    MR#:  [id number]  DATE OF BIRTH:  1925-05-06  SUBJECTIVE:  CHIEF COMPLAINT:   Chief Complaint  Patient presents with  . Cough  . Shortness of Breath   The patient has better cough and generalized weakness.  She drinks a lot of water. REVIEW OF SYSTEMS:  CONSTITUTIONAL: No fever, have fatigue or weakness.  EYES: No blurred or double vision.  EARS, NOSE, AND THROAT: No tinnitus or ear pain.  RESPIRATORY: has cough, no shortness of breath, wheezing or hemoptysis.  CARDIOVASCULAR: No chest pain, orthopnea, edema.  GASTROINTESTINAL: No nausea, vomiting, diarrhea or abdominal pain.  GENITOURINARY: No dysuria, hematuria.  ENDOCRINE: No polyuria, nocturia,  HEMATOLOGY: No anemia, easy bruising or bleeding SKIN: No rash or lesion. MUSCULOSKELETAL: No joint pain or arthritis.   NEUROLOGIC: No tingling, numbness, weakness.  PSYCHIATRY: No anxiety or depression.   ROS  DRUG ALLERGIES:   Allergies  Allergen Reactions  . Avelox [Moxifloxacin Hcl In Nacl]     Hallucinations  . Lisinopril     Facial edema   . Penicillins     unknown    VITALS:  Blood pressure 112/61, pulse 69, temperature 97.8 F (36.6 C), temperature source Oral, resp. rate 16, height 5\' 5"  (1.651 m), weight 44.5 kg, SpO2 98 %.  PHYSICAL EXAMINATION:  GENERAL:  85 y.o.-year-old patient lying in the bed with no acute distress.  Severe malnutrition. EYES: Pupils equal, round, reactive to light and accommodation. No scleral icterus. Extraocular muscles intact.  HEENT: Head atraumatic, normocephalic. Oropharynx and nasopharynx clear.  NECK:  Supple, no jugular venous distention. No thyroid enlargement, no tenderness.  LUNGS: normal sounds bilaterally, no wheezing, rales,rhonchi or crepitation. No use of accessory muscles of respiration.  CARDIOVASCULAR: S1, S2 normal. No murmurs, rubs, or gallops.  ABDOMEN: Soft, nontender,  nondistended. Bowel sounds present. No organomegaly or mass.  EXTREMITIES: No pedal edema, cyanosis, or clubbing.  NEUROLOGIC: Cranial nerves II through XII are intact. Muscle strength 4/5 in all extremities. Sensation intact. Gait not checked.  PSYCHIATRIC: The patient is alert and oriented x 3.  SKIN: No obvious rash, lesion, or ulcer.   Physical Exam LABORATORY PANEL:   CBC Recent Labs  Lab 01/28/19 1929  WBC 7.7  HGB 14.7  HCT 40.5  PLT 307   ------------------------------------------------------------------------------------------------------------------  Chemistries  Recent Labs  Lab 01/29/19 0509  02/01/19 0532 02/02/19 0411  NA 119*   < > 130* 129*  K 3.0*   < > 4.4  --   CL 83*   < > 95*  --   CO2 28   < > 25  --   GLUCOSE 101*   < > 116*  --   BUN 19   < > 14  --   CREATININE 0.65   < > 0.68  --   CALCIUM 7.9*   < > 9.2  --   MG 1.9  --   --   --    < > = values in this interval not displayed.   ------------------------------------------------------------------------------------------------------------------  Cardiac Enzymes No results for input(s): TROPONINI in the last 168 hours. ------------------------------------------------------------------------------------------------------------------  RADIOLOGY:  No results found.  ASSESSMENT AND PLAN:   Active Problems:   Hyponatremia  85 year old female who presents from home due to generalized weakness and cough and found to have a sodium level of 114.  1.  Symptomatic hyponatremia: Appreciate help by nephrology, Improving with normal saline  IV, hold hydrochlorothiazide. Discontinued normal saline IV due to concerns of congestion. Na is low at 129, continue salt tab and follow-up BMP.  Fluid restriction.  2.  Essential hypertension: Continue atenolol and hold hydrochlorothiazide 3.  Hypokalemia from poor p.o. intake: Improved with potassium supplement. 4.  Acute bronchitis-completed  doxycycline. Severe malnutrition.  Follow dietitian's recommendation. Generalized weakness.  PT evaluation suggest home health and PT. All the records are reviewed and case discussed with Care Management/Social Workerr. Management plans discussed with the patient, her son and they are in agreement.  CODE STATUS: DNR.  TOTAL TIME TAKING CARE OF THIS PATIENT: 26 minutes.   POSSIBLE D/C IN 1-2 DAYS, DEPENDING ON CLINICAL CONDITION.   Shaune Pollack M.D on 02/02/2019   Between 7am to 6pm - Pager - [phone]  After 6pm go to [email or web address] - password [email or web address]  [facility]  Office  [phone]  CC: Primary care physician; [facility]  Note: This dictation was prepared with Dragon dictation along with smaller phrase technology. Any transcriptional errors that result from this process are unintentional.

## 2019-02-02 NOTE — Care Management Note (Signed)
Case Management Note  Patient Details  Name: Barbara Lara MRN: [id number] Date of Birth: [birth year]   Plan to discharge home when medically stable.  RNCM met with son at bedside to discuss discharge disposition. He confirms he would like to move forward with home health through [facility].  CMS Medicare.gov Compare Post Acute Care list reviewed with patient and son.  RNCM confirmed with Corene Cornea from [facility] that he was aware of referral.  Patient will require order for [facility] at discharge. RNCM following   Subjective/Objective:                    Action/Plan:   Expected Discharge Date:                  Expected Discharge Plan:     In-House Referral:     Discharge planning Services  CM Consult  Post Acute Care Choice:  Home Health Choice offered to:  Patient, Adult Children  DME Arranged:    DME Agency:     HH Arranged:  RN, PT [place] Agency:  [facility]  Status of Service:  In process, will continue to follow  If discussed at Long Length of Stay Meetings, dates discussed:    Additional Comments:  Beverly Sessions, RN 02/02/2019, 2:23 PM
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
## 2019-02-03 DIAGNOSIS — E43 Unspecified severe protein-calorie malnutrition: Secondary | ICD-10-CM

## 2019-02-03 LAB — BASIC METABOLIC PANEL
Anion gap: 7 (ref 5–15)
BUN: 21 mg/dL (ref 8–23)
CALCIUM: 8.7 mg/dL — AB (ref 8.9–10.3)
CO2: 27 mmol/L (ref 22–32)
Chloride: 95 mmol/L — ABNORMAL LOW (ref 98–111)
Creatinine, Ser: 0.68 mg/dL (ref 0.44–1.00)
GFR calc Af Amer: >60 mL/min (ref >60)
GFR calc non Af Amer: >60 mL/min (ref >60)
Glucose, Bld: 107 mg/dL — ABNORMAL HIGH (ref 70–99)
Potassium: 4.3 mmol/L (ref 3.5–5.1)
Sodium: 129 mmol/L — ABNORMAL LOW (ref 135–145)

## 2019-02-03 MED ORDER — DOXYCYCLINE HYCLATE 100 MG PO CAPS: 100.0000 mg | ORAL_CAPSULE | Freq: Two times a day (BID) | ORAL | 0 refills | Status: AC

## 2019-02-03 MED ORDER — ENSURE ENLIVE PO LIQD: 237.0000 mL | Freq: Two times a day (BID) | ORAL | 12 refills | Status: AC
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
## 2019-02-03 NOTE — Discharge Instructions (Signed)
Hyponatremia    Hyponatremia is when the amount of salt (sodium) in your blood is too low. When salt levels are low, your cells absorb extra water and they swell. The swelling happens throughout the body, but it mostly affects the brain.  Follow these instructions at home:   Take medicines only as told by your doctor. Many medicines can make this condition worse. Talk with your doctor about any medicines that you are currently taking.   Carefully follow a recommended diet as told by your doctor.   Carefully follow instructions from your doctor about fluid restrictions.   Keep all follow-up visits as told by your doctor. This is important.   Do not drink alcohol.  Contact a doctor if:   You feel sicker to your stomach (nauseous).   You feel more confused.   You feel more tired (fatigued).   Your headache gets worse.   You feel weaker.   Your symptoms go away and then they come back.   You have trouble following the diet instructions.  Get help right away if:   You start to twitch and shake (have a seizure).   You pass out (faint).   You keep having watery poop (diarrhea).   You keep throwing up (vomiting).  This information is not intended to replace advice given to you by your health care provider. Make sure you discuss any questions you have with your health care provider.  Document Released: 08/07/2011 Document Revised: 05/02/2016 Document Reviewed: 11/21/2014  Elsevier Interactive Patient Education  2019 [employer]

## 2019-02-03 NOTE — Progress Notes (Signed)
Discharge order received. Patient is alert and oriented. Vital signs stable . No signs of acute distress. Discharge instructions given. Patient verbalized understanding. No other issues noted at this time.   

## 2019-02-03 NOTE — Care Management Note (Signed)
Case Management Note  Patient Details  Name: Barbara Lara MRN: [id number] Date of Birth: 1925-01-05   Patient discharged home today.  Barbara Cower with [facility] notified of discharge.  BSC delivered to room prior to discharge  Subjective/Objective:                    Action/Plan:   Expected Discharge Date:  02/03/19               Expected Discharge Plan:  Home w Home Health Services  In-House Referral:     Discharge planning Services  CM Consult  Post Acute Care Choice:  Home Health Choice offered to:  Patient, Adult Children  DME Arranged:    DME Agency:     HH Arranged:  RN, PT, Nurse's Aide HH Agency:  [facility]  Status of Service:  Completed, signed off  If discussed at Long Length of Stay Meetings, dates discussed:    Additional Comments:  Chapman Fitch, RN 02/03/2019, 4:35 PM

## 2019-02-03 NOTE — Discharge Summary (Signed)
[employer] [facility] - [place] at [facility]   PATIENT NAME: Barbara Lara    MR#:  [id number]  DATE OF BIRTH:  10/21/1925  DATE OF ADMISSION:  01/28/2019 ADMITTING PHYSICIAN: Altamese DillingVaibhavkumar Vachhani, MD  DATE OF DISCHARGE: 02/03/2019  PRIMARY CARE PHYSICIAN: [place], [facility]    ADMISSION DIAGNOSIS:  Hyponatremia [E87.1]  DISCHARGE DIAGNOSIS:  Active Problems:   Hyponatremia   Protein-calorie malnutrition, severe   SECONDARY DIAGNOSIS:   Past Medical History:  Diagnosis Date  . Hypercholesteremia   . Hypertension     HOSPITAL COURSE:   85 year old female who presents from home due to generalized weakness and cough and found to have a sodium level of 114.  1.  Symptomatic hyponatremia: Patient's sodium level has improved by discontinuing HCTZ.  He was evaluated by nephrology in the hospital.  He needs a repeat BMP tomorrow or the next day by his primary care physician.  2.  Essential hypertension: Continue atenolol and stop hydrochlorothiazide 3.  Hypokalemia from poor p.o. intake: This was repleted  4.  Acute bronchitis: Patient will be discharged on oral doxycycline  5.  Severe protein calorie malnutrition: Continue oral supplements.  DISCHARGE CONDITIONS AND DIET:   Stable for discharge on regular diet  CONSULTS OBTAINED:  Treatment Team:  Altamese DillingVachhani, Vaibhavkumar, MD  DRUG ALLERGIES:   Allergies  Allergen Reactions  . Avelox [Moxifloxacin Hcl In Nacl]     Hallucinations  . Lisinopril     Facial edema   . Penicillins     unknown    DISCHARGE MEDICATIONS:   Allergies as of 02/03/2019      Reactions   Avelox [moxifloxacin Hcl In Nacl]    Hallucinations   Lisinopril    Facial edema   Penicillins    unknown      Medication List    STOP taking these medications   hydrochlorothiazide 25 MG tablet Commonly known as:  HYDRODIURIL     TAKE these medications   ASPIRIN ADULT LOW DOSE 81 MG EC tablet Generic drug:  aspirin Take  81 mg by mouth daily.   atenolol 50 MG tablet Commonly known as:  TENORMIN Take 75 mg by mouth 2 (two) times daily.   cloNIDine 0.1 MG tablet Commonly known as:  CATAPRES Take 0.1 mg by mouth daily as needed. (take only if systolic blood pressure is greater than 180)   doxycycline 100 MG capsule Commonly known as:  VIBRAMYCIN Take 1 capsule (100 mg total) by mouth 2 (two) times daily for 4 days.   feeding supplement (ENSURE ENLIVE) Liqd Take 237 mLs by mouth 2 (two) times daily between meals.   simvastatin 10 MG tablet Commonly known as:  ZOCOR Take 10 mg by mouth Nightly.            Durable Medical Equipment  (From admission, onward)         Start     Ordered   02/03/19 1021  For home use only DME Bedside commode  Once    Question:  Patient needs a bedside commode to treat with the following condition  Answer:  Weakness   02/03/19 1021            Today   CHIEF COMPLAINT:   Doing ok this am Cough +   VITAL SIGNS:  Blood pressure 132/61, pulse 73, temperature 97.6 F (36.4 C), temperature source Oral, resp. rate 16, height 5\' 5"  (1.651 m), weight 44.5 kg, SpO2 97 %.   REVIEW OF SYSTEMS:  Review of Systems  Constitutional: Negative.  Negative for chills, fever and malaise/fatigue.  HENT: Negative.  Negative for ear discharge, ear pain, hearing loss, nosebleeds and sore throat.   Eyes: Negative.  Negative for blurred vision and pain.  Respiratory: Positive for cough. Negative for hemoptysis, shortness of breath and wheezing.   Cardiovascular: Negative.  Negative for chest pain, palpitations and leg swelling.  Gastrointestinal: Negative.  Negative for abdominal pain, blood in stool, diarrhea, nausea and vomiting.  Genitourinary: Negative.  Negative for dysuria.  Musculoskeletal: Negative.  Negative for back pain.  Skin: Negative.   Neurological: Negative for dizziness, tremors, speech change, focal weakness, seizures and headaches.  Endo/Heme/Allergies:  Negative.  Does not bruise/bleed easily.  Psychiatric/Behavioral: Negative.  Negative for depression, hallucinations and suicidal ideas.     PHYSICAL EXAMINATION:  GENERAL:  85 y.o.-year-old patient lying in the bed with no acute distress.  NECK:  Supple, no jugular venous distention. No thyroid enlargement, no tenderness.  LUNGS: Normal breath sounds bilaterally, no wheezing, rales,rhonchi  No use of accessory muscles of respiration.  CARDIOVASCULAR: S1, S2 normal. No murmurs, rubs, or gallops.  ABDOMEN: Soft, non-tender, non-distended. Bowel sounds present. No organomegaly or mass.  EXTREMITIES: No pedal edema, cyanosis, or clubbing.  PSYCHIATRIC: The patient is alert and oriented x 3.  SKIN: No obvious rash, lesion, or ulcer.   DATA REVIEW:   CBC Recent Labs  Lab 01/28/19 1929  WBC 7.7  HGB 14.7  HCT 40.5  PLT 307    Chemistries  Recent Labs  Lab 01/29/19 0509  02/03/19 0529  NA 119*   < > 129*  K 3.0*   < > 4.3  CL 83*   < > 95*  CO2 28   < > 27  GLUCOSE 101*   < > 107*  BUN 19   < > 21  CREATININE 0.65   < > 0.68  CALCIUM 7.9*   < > 8.7*  MG 1.9  --   --    < > = values in this interval not displayed.    Cardiac Enzymes No results for input(s): TROPONINI in the last 168 hours.  Microbiology Results  @MICRORSLT48 @  RADIOLOGY:  No results found.    Allergies as of 02/03/2019      Reactions   Avelox [moxifloxacin Hcl In Nacl]    Hallucinations   Lisinopril    Facial edema   Penicillins    unknown      Medication List    STOP taking these medications   hydrochlorothiazide 25 MG tablet Commonly known as:  HYDRODIURIL     TAKE these medications   ASPIRIN ADULT LOW DOSE 81 MG EC tablet Generic drug:  aspirin Take 81 mg by mouth daily.   atenolol 50 MG tablet Commonly known as:  TENORMIN Take 75 mg by mouth 2 (two) times daily.   cloNIDine 0.1 MG tablet Commonly known as:  CATAPRES Take 0.1 mg by mouth daily as needed. (take only if  systolic blood pressure is greater than 180)   doxycycline 100 MG capsule Commonly known as:  VIBRAMYCIN Take 1 capsule (100 mg total) by mouth 2 (two) times daily for 4 days.   feeding supplement (ENSURE ENLIVE) Liqd Take 237 mLs by mouth 2 (two) times daily between meals.   simvastatin 10 MG tablet Commonly known as:  ZOCOR Take 10 mg by mouth Nightly.            Durable Medical Equipment  (From admission, onward)  Start     Ordered   02/03/19 1021  For home use only DME Bedside commode  Once    Question:  Patient needs a bedside commode to treat with the following condition  Answer:  Weakness   02/03/19 1021             Management plans discussed with the patient and son and they are in agreement. Stable for discharge home with [facility]  Patient should follow up with PCP  CODE STATUS:     Code Status Orders  (From admission, onward)         Start     Ordered   01/28/19 1352  Do not attempt resuscitation (DNR)  Continuous    Question Answer Comment  In the event of cardiac or respiratory ARREST Do not call a "code blue"   In the event of cardiac or respiratory ARREST Do not perform Intubation, CPR, defibrillation or ACLS   In the event of cardiac or respiratory ARREST Use medication by any route, position, wound care, and other measures to relive pain and suffering. May use oxygen, suction and manual treatment of airway obstruction as needed for comfort.      01/28/19 1352        Code Status History    This patient has a current code status but no historical code status.      TOTAL TIME TAKING CARE OF THIS PATIENT: 38 minutes.    Note: This dictation was prepared with Dragon dictation along with smaller phrase technology. Any transcriptional errors that result from this process are unintentional.  Ruthann Angulo M.D on 02/03/2019 at 12:13 PM  Between 7am to 6pm - Pager - [phone] After 6pm go to [email or web address] - Social research officer, government  [facility]  Office  [phone]  CC: Primary care physician; [facility]

## 2019-07-03 IMAGING — CR DG HIP (WITH OR WITHOUT PELVIS) 2-3V*R*
1 series · 3 of 3 positions shown · non-contrast
Comparison: None.

CLINICAL DATA: [AGE] female status post fall in driveway
yesterday with right hip pain.

EXAM:
DG HIP (WITH OR WITHOUT PELVIS) 2-3V RIGHT

[Series 1: dg hip unilat w or w/o pelvis 2-3 views  · non-contrast · 0.14mm/px · 3 of 3 slices shown]
[im 1/3]
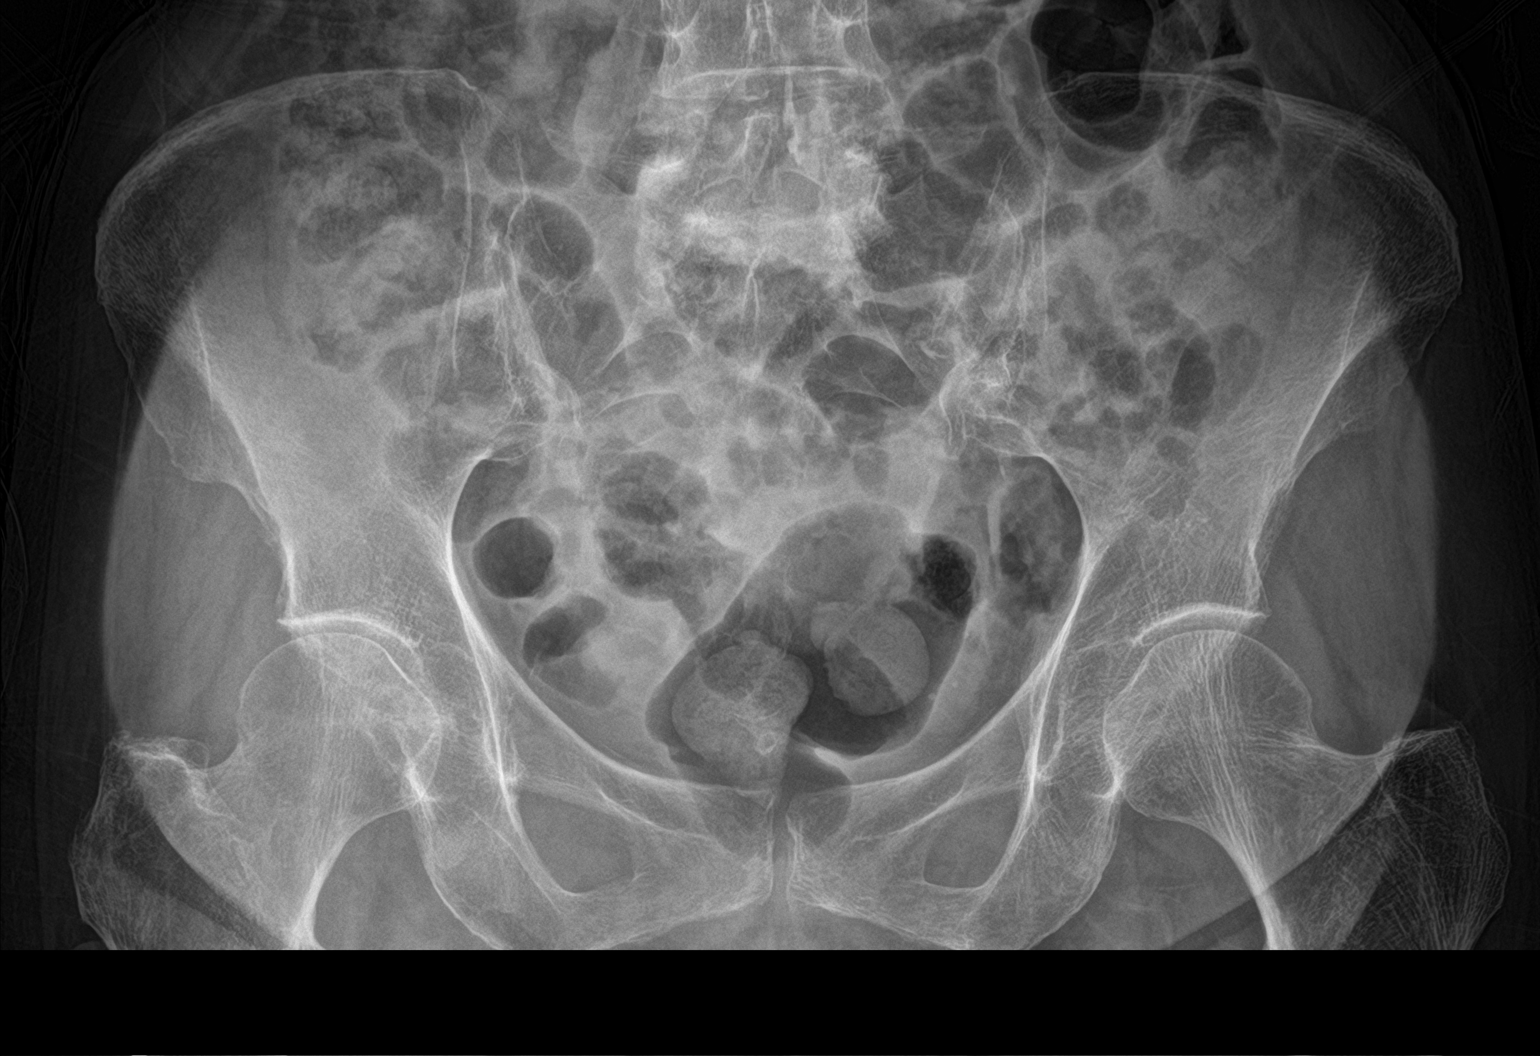
[im 2/3]
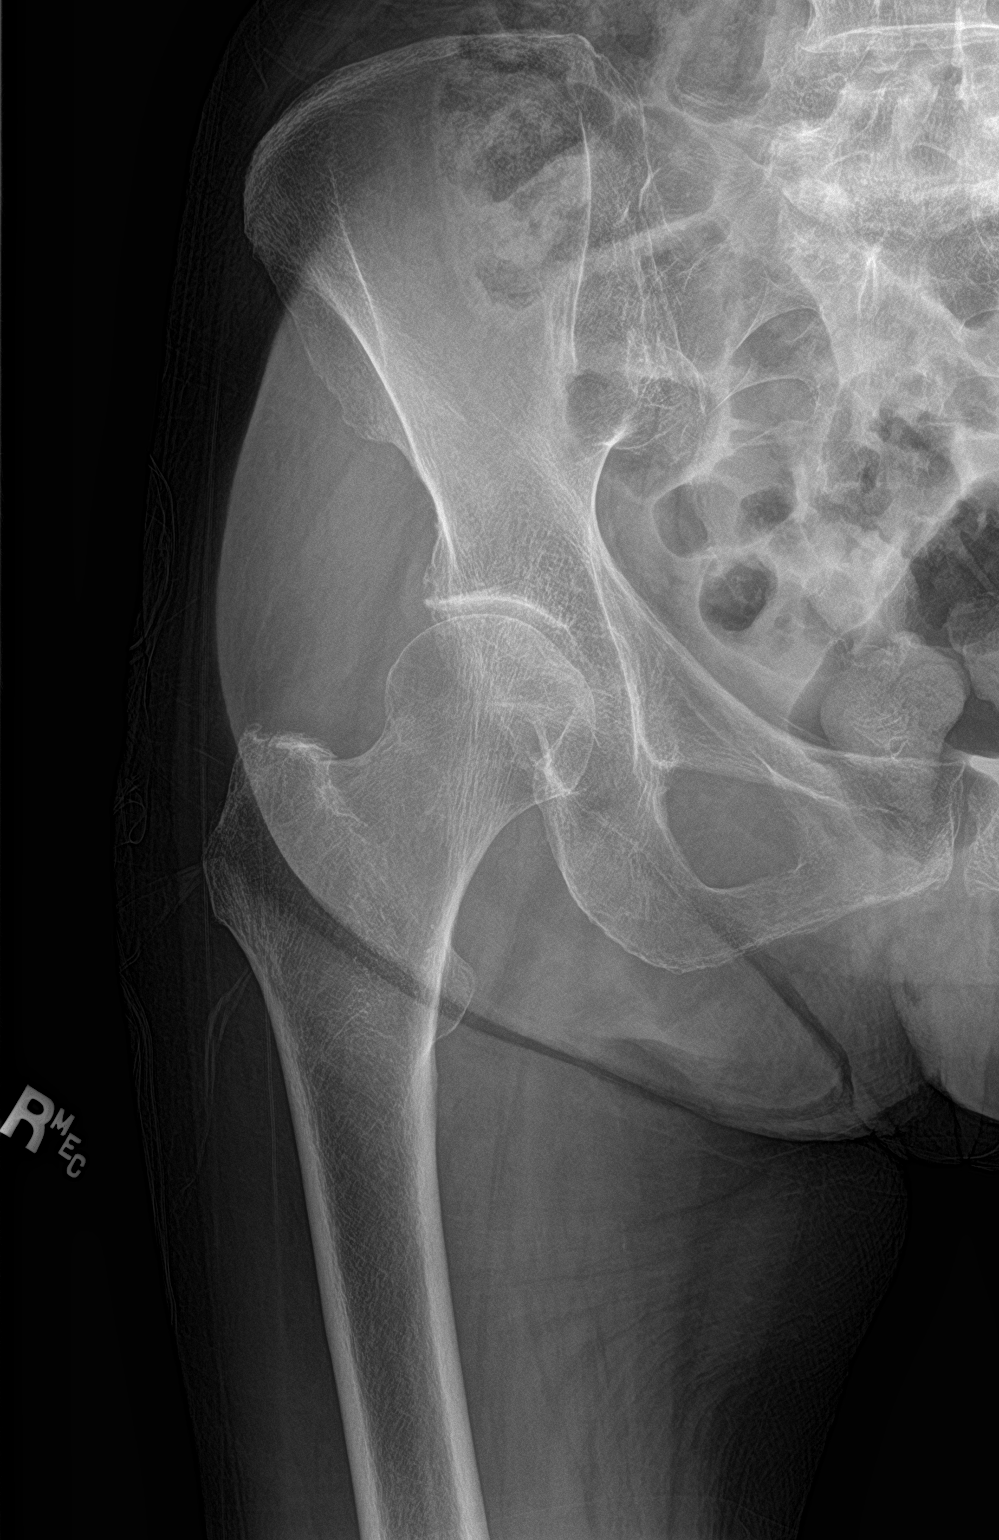
[im 3/3]
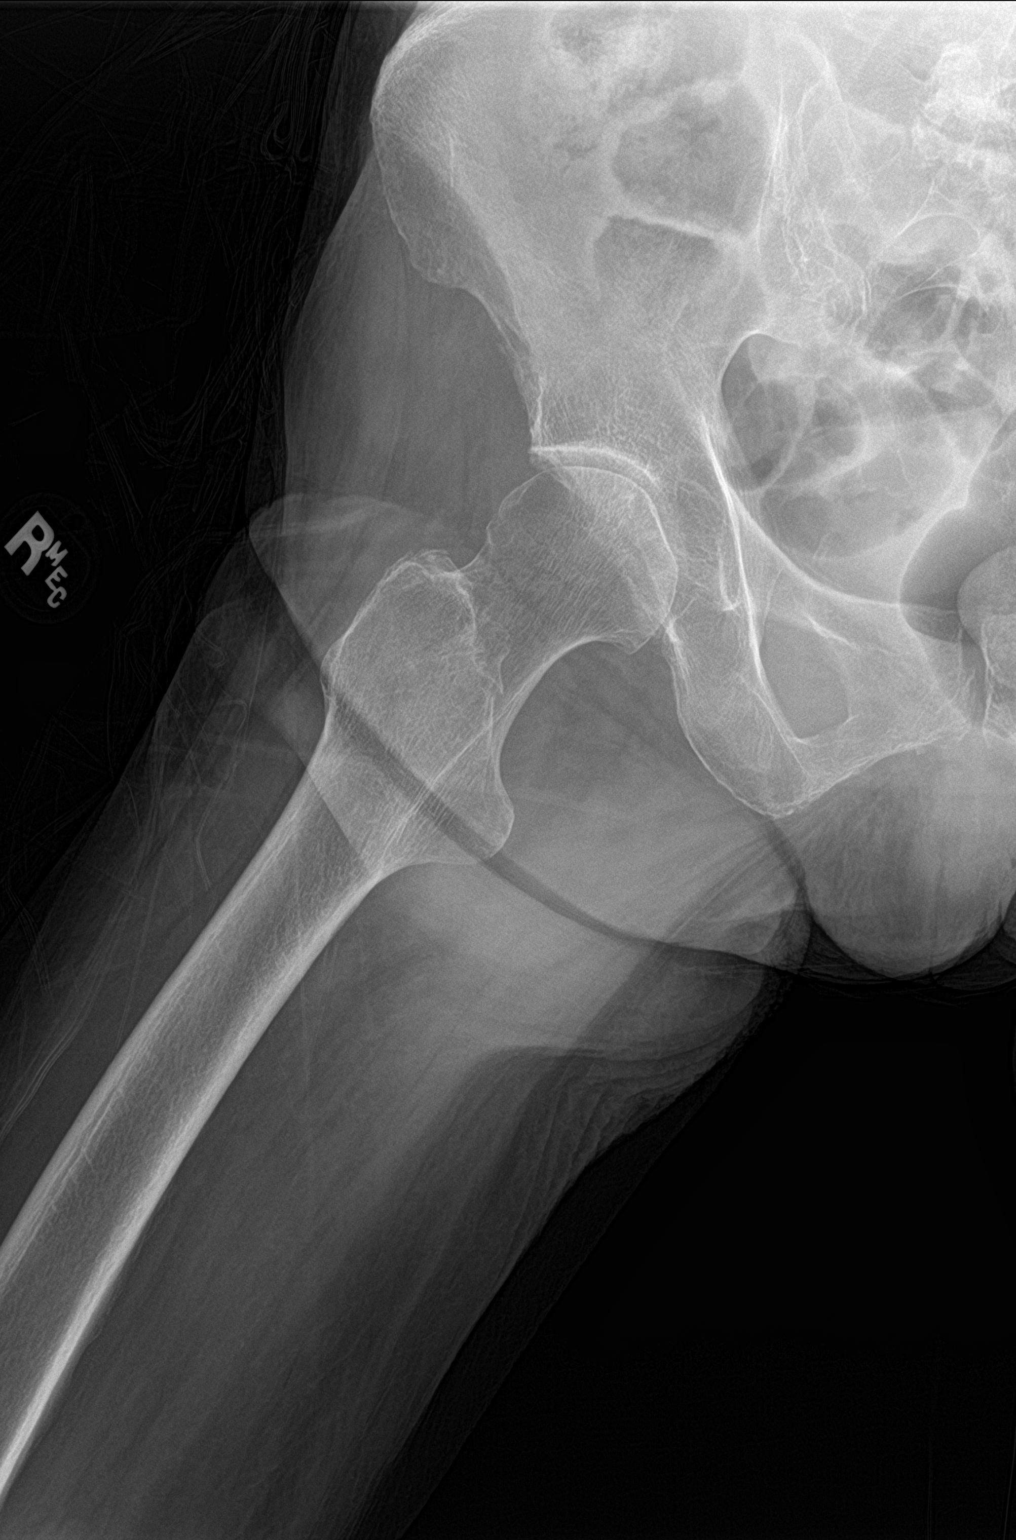

[3 of 3 positions shown; findings below may reference images not displayed]

FINDINGS: The femoral heads are normally located. Hip joint spaces are within
normal limits for age, mildly decreased on the right. Osteopenia. No
pelvis fracture identified. Sacrum detail is limited due to
overlying bowel. Negative bowel gas pattern. Grossly intact proximal
left femur.

The proximal right femur appears intact.
IMPRESSION: No acute fracture or dislocation identified about the right hip or
pelvis.
If occult hip fracture is suspected or if the patient is unable to
weightbear, MRI is the preferred modality for further evaluation.

## 2020-01-09 IMAGING — CR DG CHEST 2V
2 series · 2 of 2 positions shown · non-contrast
Comparison: None.

CLINICAL DATA: Cough and congestion.

EXAM:
CHEST - 2 VIEW

[chest lat]
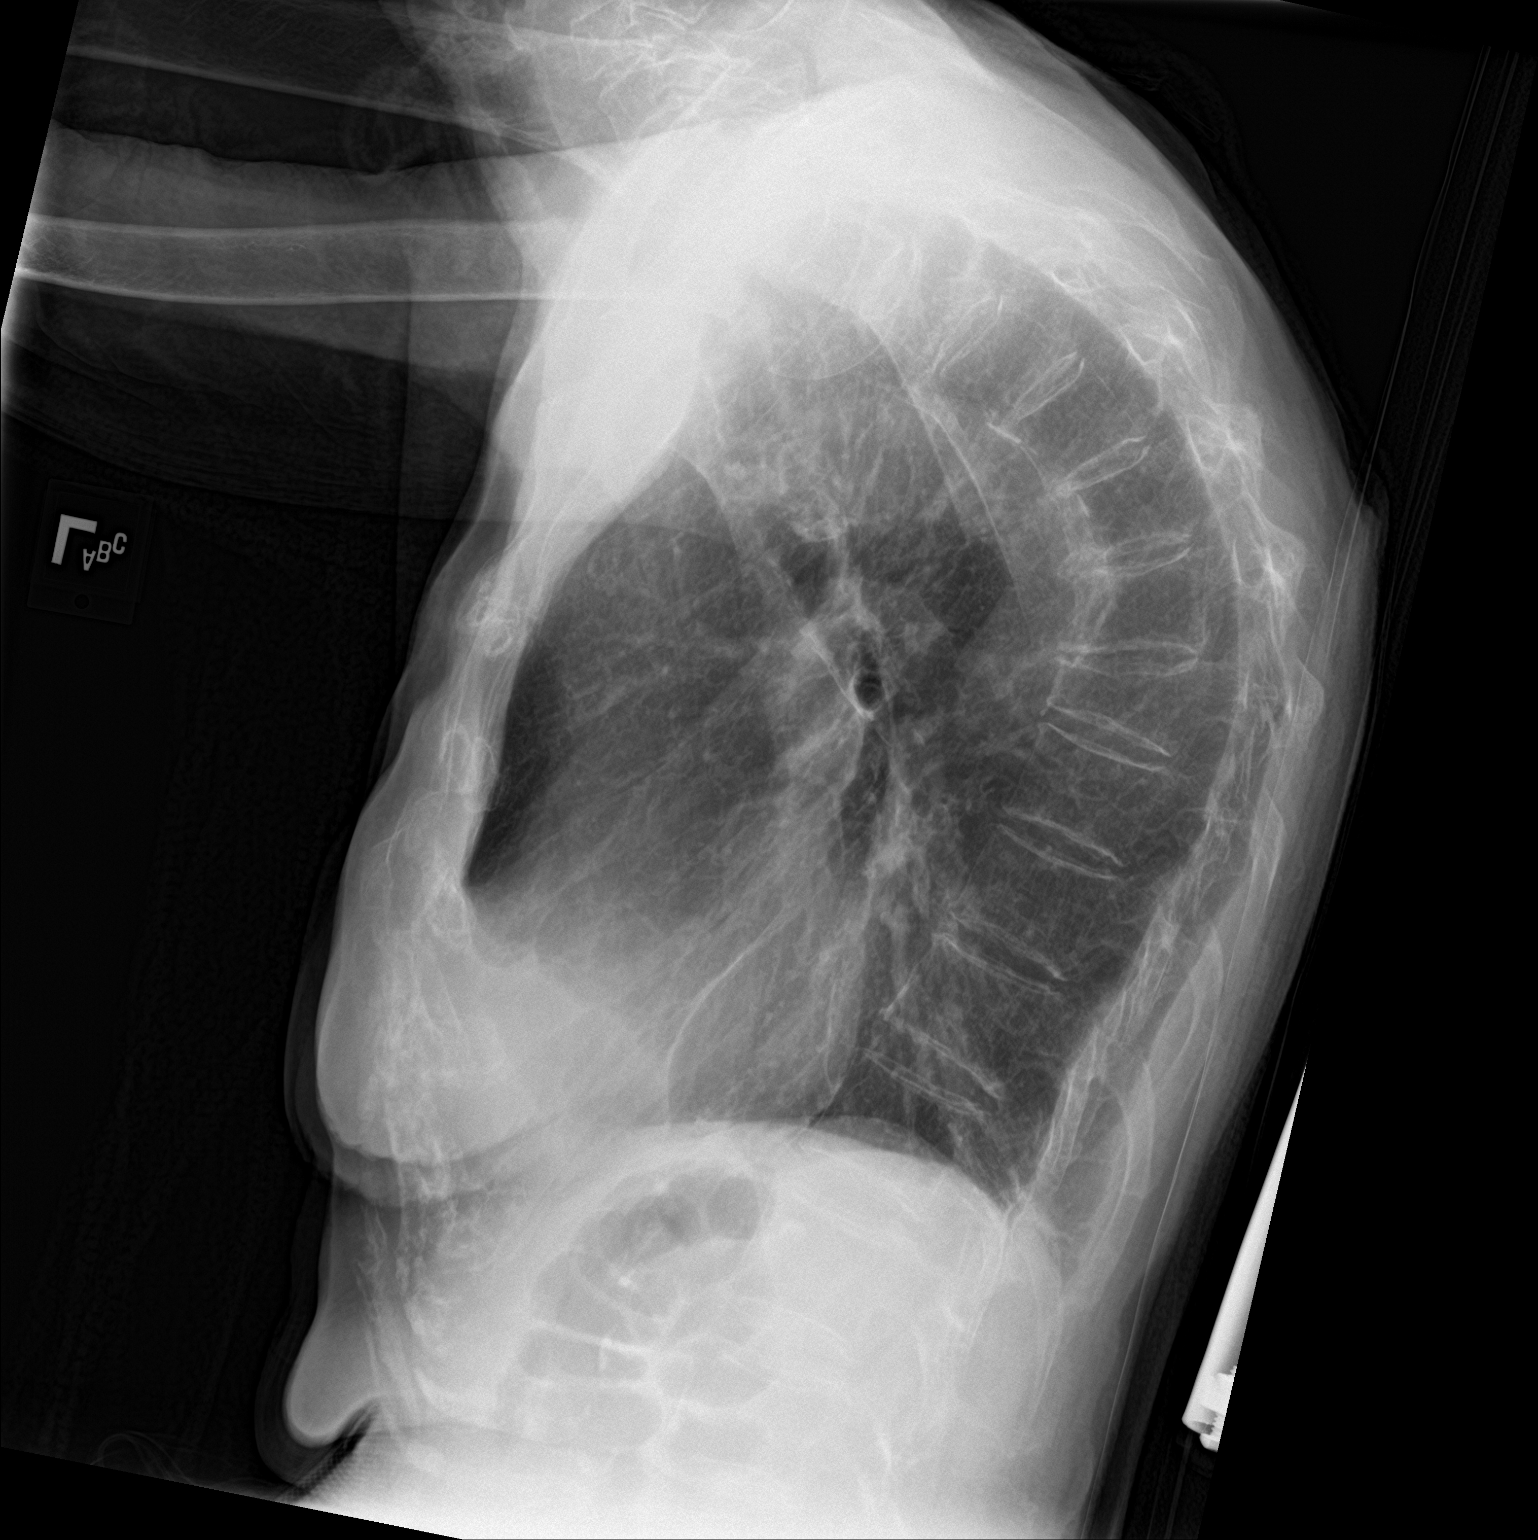

[chest ap]
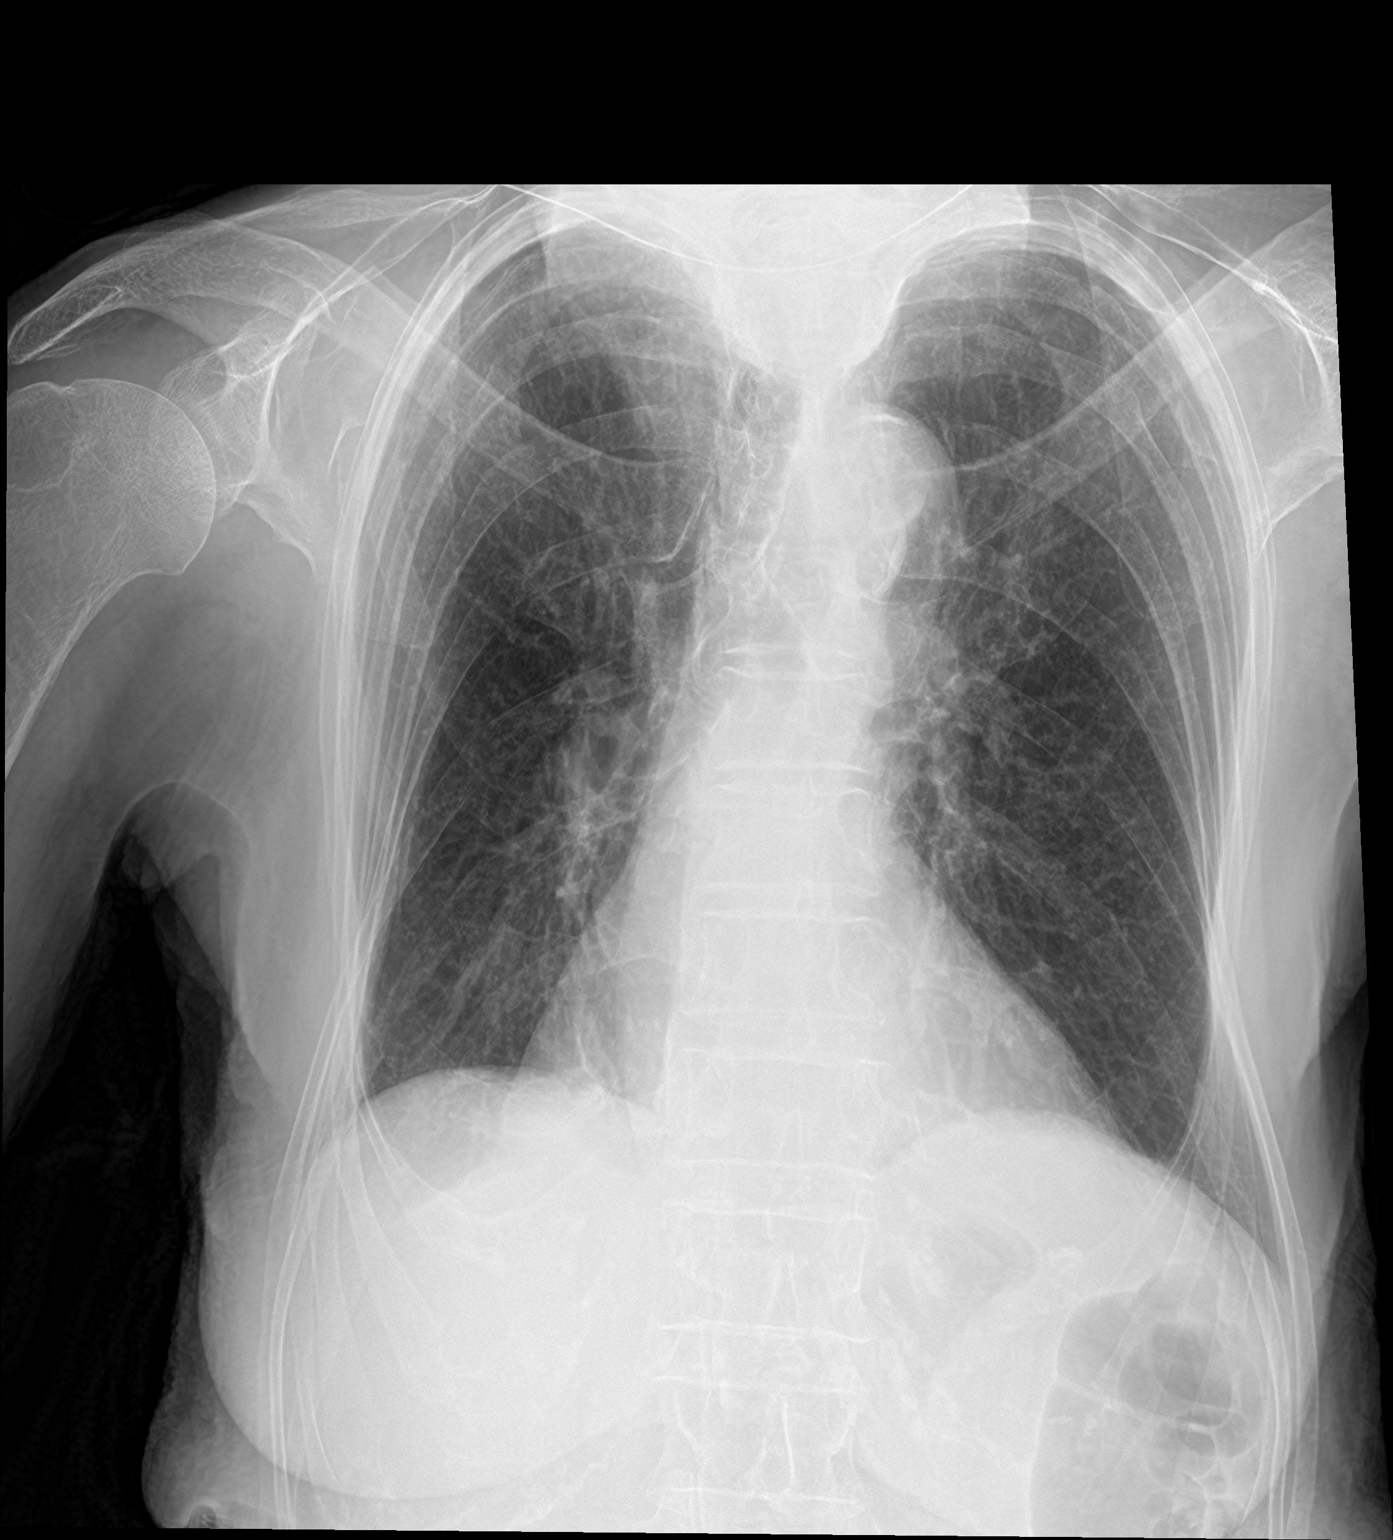

[2 of 2 positions shown; findings below may reference images not displayed]

FINDINGS: The heart is mildly enlarged. There is moderate tortuosity and
calcification of the thoracic aorta. The lungs are clear of an acute
process. No worrisome pulmonary lesions. No pleural effusion. The
bony thorax is intact. Remote healed rib fractures are noted and
there is also a remote appearing compression fracture of T8.
IMPRESSION: No acute cardiopulmonary findings.

## 2023-01-09 DEATH — deceased
